# Patient Record
Sex: Female | Born: 1945
Health system: Southern US, Community
[De-identification: ages and names within clinical notes are randomized; demographics above are authoritative.]

## PROBLEM LIST (undated history)

## (undated) DIAGNOSIS — G5601 Carpal tunnel syndrome, right upper limb: Secondary | ICD-10-CM

## (undated) DIAGNOSIS — G5602 Carpal tunnel syndrome, left upper limb: Secondary | ICD-10-CM

## (undated) DIAGNOSIS — E785 Hyperlipidemia, unspecified: Secondary | ICD-10-CM

## (undated) DIAGNOSIS — G8929 Other chronic pain: Secondary | ICD-10-CM

## (undated) DIAGNOSIS — M1990 Unspecified osteoarthritis, unspecified site: Secondary | ICD-10-CM

## (undated) DIAGNOSIS — T7840XA Allergy, unspecified, initial encounter: Secondary | ICD-10-CM

## (undated) DIAGNOSIS — K219 Gastro-esophageal reflux disease without esophagitis: Secondary | ICD-10-CM

## (undated) DIAGNOSIS — L439 Lichen planus, unspecified: Secondary | ICD-10-CM

## (undated) DIAGNOSIS — I10 Essential (primary) hypertension: Secondary | ICD-10-CM

## (undated) DIAGNOSIS — M25512 Pain in left shoulder: Secondary | ICD-10-CM

## (undated) DIAGNOSIS — N189 Chronic kidney disease, unspecified: Secondary | ICD-10-CM

## (undated) HISTORY — DX: Other chronic pain: G89.29

## (undated) HISTORY — DX: Allergy, unspecified, initial encounter: T78.40XA

## (undated) HISTORY — PX: TONSILLECTOMY: SUR1361

## (undated) HISTORY — DX: Pain in left shoulder: M25.512

## (undated) HISTORY — PX: CATARACT EXTRACTION W/ INTRAOCULAR LENS  IMPLANT, BILATERAL: SHX1307

## (undated) HISTORY — PX: TRIGGER FINGER RELEASE: SHX641

## (undated) HISTORY — PX: CARPAL TUNNEL RELEASE: SHX101

## (undated) HISTORY — PX: EYE SURGERY: SHX253

## (undated) HISTORY — DX: Carpal tunnel syndrome, right upper limb: G56.01

## (undated) HISTORY — DX: Chronic kidney disease, unspecified: N18.9

## (undated) HISTORY — DX: Hyperlipidemia, unspecified: E78.5

## (undated) HISTORY — DX: Essential (primary) hypertension: I10

## (undated) HISTORY — PX: VAGINAL HYSTERECTOMY: SUR661

## (undated) HISTORY — DX: Lichen planus, unspecified: L43.9

## (undated) HISTORY — DX: Gastro-esophageal reflux disease without esophagitis: K21.9

## (undated) HISTORY — DX: Unspecified osteoarthritis, unspecified site: M19.90

## (undated) HISTORY — DX: Carpal tunnel syndrome, left upper limb: G56.02

---

## 1999-02-21 ENCOUNTER — Other Ambulatory Visit: Admission: RE | Admit: 1999-02-21 | Discharge: 1999-02-21 | Payer: Self-pay | Admitting: Family Medicine

## 2000-03-21 ENCOUNTER — Other Ambulatory Visit: Admission: RE | Admit: 2000-03-21 | Discharge: 2000-03-21 | Payer: Self-pay | Admitting: Family Medicine

## 2001-05-12 ENCOUNTER — Other Ambulatory Visit: Admission: RE | Admit: 2001-05-12 | Discharge: 2001-05-12 | Payer: Self-pay | Admitting: Family Medicine

## 2002-01-22 HISTORY — PX: PARATHYROIDECTOMY: SHX19

## 2002-03-05 ENCOUNTER — Emergency Department (HOSPITAL_COMMUNITY): Admission: EM | Admit: 2002-03-05 | Discharge: 2002-03-05 | Payer: Self-pay

## 2002-07-28 ENCOUNTER — Other Ambulatory Visit: Admission: RE | Admit: 2002-07-28 | Discharge: 2002-07-28 | Payer: Self-pay | Admitting: Family Medicine

## 2002-08-11 ENCOUNTER — Encounter: Payer: Self-pay | Admitting: Surgery

## 2002-08-11 ENCOUNTER — Ambulatory Visit (HOSPITAL_COMMUNITY): Admission: RE | Admit: 2002-08-11 | Discharge: 2002-08-11 | Payer: Self-pay | Admitting: Surgery

## 2002-09-02 ENCOUNTER — Encounter: Payer: Self-pay | Admitting: Surgery

## 2002-09-07 ENCOUNTER — Ambulatory Visit (HOSPITAL_COMMUNITY): Admission: RE | Admit: 2002-09-07 | Discharge: 2002-09-08 | Payer: Self-pay | Admitting: Surgery

## 2002-09-07 ENCOUNTER — Encounter (INDEPENDENT_AMBULATORY_CARE_PROVIDER_SITE_OTHER): Payer: Self-pay | Admitting: *Deleted

## 2003-01-23 HISTORY — PX: PLANTAR FASCIA SURGERY: SHX746

## 2003-06-30 ENCOUNTER — Other Ambulatory Visit: Admission: RE | Admit: 2003-06-30 | Discharge: 2003-06-30 | Payer: Self-pay | Admitting: Family Medicine

## 2004-06-30 ENCOUNTER — Ambulatory Visit: Payer: Self-pay | Admitting: Family Medicine

## 2004-10-23 ENCOUNTER — Ambulatory Visit: Payer: Self-pay | Admitting: Family Medicine

## 2004-10-30 ENCOUNTER — Ambulatory Visit: Payer: Self-pay | Admitting: Family Medicine

## 2004-11-01 ENCOUNTER — Ambulatory Visit: Payer: Self-pay | Admitting: Internal Medicine

## 2005-02-23 ENCOUNTER — Ambulatory Visit: Payer: Self-pay | Admitting: Family Medicine

## 2005-10-25 ENCOUNTER — Ambulatory Visit: Payer: Self-pay | Admitting: Family Medicine

## 2005-11-01 ENCOUNTER — Ambulatory Visit: Payer: Self-pay | Admitting: Family Medicine

## 2005-11-01 ENCOUNTER — Encounter: Payer: Self-pay | Admitting: Family Medicine

## 2005-11-01 ENCOUNTER — Other Ambulatory Visit: Admission: RE | Admit: 2005-11-01 | Discharge: 2005-11-01 | Payer: Self-pay | Admitting: Family Medicine

## 2006-09-06 DIAGNOSIS — I10 Essential (primary) hypertension: Secondary | ICD-10-CM

## 2006-09-06 DIAGNOSIS — J309 Allergic rhinitis, unspecified: Secondary | ICD-10-CM | POA: Insufficient documentation

## 2006-11-13 ENCOUNTER — Ambulatory Visit: Payer: Self-pay | Admitting: Family Medicine

## 2006-11-13 LAB — CONVERTED CEMR LAB
AST: 21 units/L (ref 0–37)
Albumin: 3.6 g/dL (ref 3.5–5.2)
Basophils Absolute: 0.1 10*3/uL (ref 0.0–0.1)
Bilirubin, Direct: 0.1 mg/dL (ref 0.0–0.3)
CO2: 34 meq/L — ABNORMAL HIGH (ref 19–32)
Chloride: 107 meq/L (ref 96–112)
Cholesterol: 193 mg/dL (ref 0–200)
Creatinine, Ser: 0.9 mg/dL (ref 0.4–1.2)
Eosinophils Relative: 2.5 % (ref 0.0–5.0)
GFR calc Af Amer: 82 mL/min
Glucose, Bld: 93 mg/dL (ref 70–99)
Glucose, Urine, Semiquant: NEGATIVE
HCT: 38.5 % (ref 36.0–46.0)
HDL: 42.6 mg/dL (ref >39.0)
LDL Cholesterol: 127 mg/dL — ABNORMAL HIGH (ref 0–99)
Lymphocytes Relative: 31 % (ref 12.0–46.0)
Nitrite: NEGATIVE
Platelets: 356 10*3/uL (ref 150–400)
Potassium: 4.1 meq/L (ref 3.5–5.1)
Protein, U semiquant: NEGATIVE
RBC: 4.35 M/uL (ref 3.87–5.11)
RDW: 11.6 % (ref 11.5–14.6)
Sodium: 149 meq/L — ABNORMAL HIGH (ref 135–145)
Urobilinogen, UA: NEGATIVE
WBC: 6.9 10*3/uL (ref 4.5–10.5)

## 2006-11-26 ENCOUNTER — Ambulatory Visit: Payer: Self-pay | Admitting: Family Medicine

## 2006-11-26 ENCOUNTER — Other Ambulatory Visit: Admission: RE | Admit: 2006-11-26 | Discharge: 2006-11-26 | Payer: Self-pay | Admitting: Family Medicine

## 2006-11-26 ENCOUNTER — Encounter: Payer: Self-pay | Admitting: Family Medicine

## 2006-12-17 ENCOUNTER — Ambulatory Visit: Payer: Self-pay | Admitting: Family Medicine

## 2006-12-17 DIAGNOSIS — R319 Hematuria, unspecified: Secondary | ICD-10-CM | POA: Insufficient documentation

## 2006-12-17 LAB — CONVERTED CEMR LAB: Ketones, urine, test strip: NEGATIVE

## 2007-01-06 ENCOUNTER — Ambulatory Visit: Payer: Self-pay | Admitting: Family Medicine

## 2007-01-06 LAB — CONVERTED CEMR LAB
Glucose, Urine, Semiquant: NEGATIVE
Protein, U semiquant: NEGATIVE
Specific Gravity, Urine: >=1.03
Urobilinogen, UA: NEGATIVE

## 2007-11-25 ENCOUNTER — Ambulatory Visit: Payer: Self-pay | Admitting: Family Medicine

## 2007-11-25 LAB — CONVERTED CEMR LAB
Albumin: 3.6 g/dL (ref 3.5–5.2)
Alkaline Phosphatase: 84 units/L (ref 39–117)
BUN: 13 mg/dL (ref 6–23)
Bilirubin Urine: NEGATIVE
CO2: 34 meq/L — ABNORMAL HIGH (ref 19–32)
Eosinophils Relative: 1.7 % (ref 0.0–5.0)
GFR calc non Af Amer: 54 mL/min
Glucose, Bld: 92 mg/dL (ref 70–99)
Glucose, Urine, Semiquant: NEGATIVE
HCT: 39.3 % (ref 36.0–46.0)
HDL: 44.2 mg/dL (ref >39.0)
Hemoglobin: 13.7 g/dL (ref 12.0–15.0)
Lymphocytes Relative: 28.5 % (ref 12.0–46.0)
MCV: 88 fL (ref 78.0–100.0)
Monocytes Relative: 8.9 % (ref 3.0–12.0)
Neutro Abs: 4.2 10*3/uL (ref 1.4–7.7)
Nitrite: NEGATIVE
Platelets: 364 10*3/uL (ref 150–400)
Protein, U semiquant: NEGATIVE
RDW: 11.9 % (ref 11.5–14.6)
Sodium: 147 meq/L — ABNORMAL HIGH (ref 135–145)
TSH: 1.77 microintl units/mL (ref 0.35–5.50)
Total Bilirubin: 0.7 mg/dL (ref 0.3–1.2)
Total Protein: 7.6 g/dL (ref 6.0–8.3)
Triglycerides: 114 mg/dL (ref 0–149)
Urobilinogen, UA: 0.2
WBC Urine, dipstick: NEGATIVE

## 2007-12-02 ENCOUNTER — Ambulatory Visit: Payer: Self-pay | Admitting: Family Medicine

## 2008-07-12 ENCOUNTER — Encounter: Payer: Self-pay | Admitting: Family Medicine

## 2008-07-13 ENCOUNTER — Encounter: Payer: Self-pay | Admitting: Family Medicine

## 2008-11-26 ENCOUNTER — Ambulatory Visit: Payer: Self-pay | Admitting: Family Medicine

## 2008-11-26 LAB — CONVERTED CEMR LAB
ALT: 13 units/L (ref 0–35)
AST: 21 units/L (ref 0–37)
Albumin: 3.7 g/dL (ref 3.5–5.2)
Basophils Absolute: 0 10*3/uL (ref 0.0–0.1)
Basophils Relative: 0.6 % (ref 0.0–3.0)
Bilirubin Urine: NEGATIVE
Chloride: 105 meq/L (ref 96–112)
Cholesterol: 190 mg/dL (ref 0–200)
Eosinophils Absolute: 0.1 10*3/uL (ref 0.0–0.7)
Eosinophils Relative: 1.5 % (ref 0.0–5.0)
HCT: 40.6 % (ref 36.0–46.0)
Lymphocytes Relative: 22.9 % (ref 12.0–46.0)
Lymphs Abs: 1.9 10*3/uL (ref 0.7–4.0)
Monocytes Absolute: 0.6 10*3/uL (ref 0.1–1.0)
Monocytes Relative: 7.3 % (ref 3.0–12.0)
Nitrite: NEGATIVE
Potassium: 3.7 meq/L (ref 3.5–5.1)
Sodium: 144 meq/L (ref 135–145)
Specific Gravity, Urine: 1.015 (ref 1.000–1.030)
Total Bilirubin: 0.7 mg/dL (ref 0.3–1.2)
Total Protein, Urine: NEGATIVE mg/dL
VLDL: 21.8 mg/dL (ref 0.0–40.0)
pH: 6 (ref 5.0–8.0)

## 2008-12-10 ENCOUNTER — Ambulatory Visit: Payer: Self-pay | Admitting: Family Medicine

## 2008-12-10 DIAGNOSIS — M25559 Pain in unspecified hip: Secondary | ICD-10-CM

## 2008-12-14 ENCOUNTER — Encounter (INDEPENDENT_AMBULATORY_CARE_PROVIDER_SITE_OTHER): Payer: Self-pay | Admitting: *Deleted

## 2008-12-14 ENCOUNTER — Ambulatory Visit: Payer: Self-pay | Admitting: Family Medicine

## 2008-12-21 DIAGNOSIS — L439 Lichen planus, unspecified: Secondary | ICD-10-CM

## 2008-12-21 HISTORY — DX: Lichen planus, unspecified: L43.9

## 2009-01-18 ENCOUNTER — Encounter: Payer: Self-pay | Admitting: *Deleted

## 2009-01-26 ENCOUNTER — Ambulatory Visit: Payer: Self-pay | Admitting: Family Medicine

## 2009-12-01 ENCOUNTER — Ambulatory Visit: Payer: Self-pay | Admitting: Family Medicine

## 2009-12-01 LAB — CONVERTED CEMR LAB
AST: 23 units/L (ref 0–37)
Albumin: 3.6 g/dL (ref 3.5–5.2)
Alkaline Phosphatase: 81 units/L (ref 39–117)
BUN: 25 mg/dL — ABNORMAL HIGH (ref 6–23)
Bilirubin Urine: NEGATIVE
Calcium: 10.3 mg/dL (ref 8.4–10.5)
Creatinine, Ser: 0.9 mg/dL (ref 0.4–1.2)
GFR calc non Af Amer: 63.95 mL/min (ref >60)
Ketones, ur: NEGATIVE mg/dL
LDL Cholesterol: 131 mg/dL — ABNORMAL HIGH (ref 0–99)
Lymphs Abs: 2.1 10*3/uL (ref 0.7–4.0)
Monocytes Absolute: 0.8 10*3/uL (ref 0.1–1.0)
Monocytes Relative: 9.5 % (ref 3.0–12.0)
Platelets: 362 10*3/uL (ref 150.0–400.0)
RBC: 4.63 M/uL (ref 3.87–5.11)
RDW: 13.1 % (ref 11.5–14.6)
Specific Gravity, Urine: 1.02 (ref 1.000–1.030)
Total Bilirubin: 0.4 mg/dL (ref 0.3–1.2)
Total CHOL/HDL Ratio: 5
Total Protein, Urine: NEGATIVE mg/dL
Urine Glucose: NEGATIVE mg/dL
Urobilinogen, UA: 0.2 (ref 0.0–1.0)
WBC: 8.1 10*3/uL (ref 4.5–10.5)

## 2009-12-12 ENCOUNTER — Ambulatory Visit: Payer: Self-pay | Admitting: Family Medicine

## 2009-12-12 ENCOUNTER — Encounter: Payer: Self-pay | Admitting: Family Medicine

## 2010-01-25 ENCOUNTER — Encounter: Payer: Self-pay | Admitting: Family Medicine

## 2010-02-21 NOTE — Assessment & Plan Note (Signed)
Summary: cpx/cjr   Vital Signs:  Patient profile:   65 year old female Menstrual status:  hysterectomy Height:      62.25 inches Weight:      187 pounds BMI:     34.05 Temp:     97.9 degrees F oral BP sitting:   120 / 84  (left arm) Cuff size:   regular  Vitals Entered By: Kern Reap CMA Duncan Dull) (December 12, 2009 1:58 PM) CC: cpx Is Patient Diabetic? No Pain Assessment Patient in pain? no        CC:  cpx.  History of Present Illness: Marie Ramirez is a 65 year old, married female, nonsmoker, who comes in today for physical examination because of a history of hypertension, postmenopausal vaginal dryness,  Her hypertension is treated with Tenoretic one half tab daily BP 124/84.  She also takes 10 mg of Zyrtec nightly for allergic rhinitis, calcium and vitamin D, and Flonase nasal spray p.r.n.  She uses Premarin vaginal cream once weekly for vaginal dryness and steroid cream for eczema.  She gets routine eye care, dental care, BSE monthly, and a mammography, colonoscopy, normal, tetanus, 2003, seasonal flu 2011, Pneumovax 2009.  Allergies: 1)  ! Penicillin  Past History:  Past medical, surgical, family and social histories (including risk factors) reviewed, and no changes noted (except as noted below).  Past Medical History: Reviewed history from 09/06/2006 and no changes required. Allergic rhinitis Hypertension PMS  Past Surgical History: Reviewed history from 09/06/2006 and no changes required. Childbirth x 2 Dental work TAH Colonoscopy-12/10/2001  Family History: Reviewed history from 11/26/2006 and no changes required. mother has dementia  Social History: Reviewed history from 11/26/2006 and no changes required. Married Never Smoked Alcohol use-no Drug use-no Regular exercise-yes  Review of Systems      See HPI  Physical Exam  General:  Well-developed,well-nourished,in no acute distress; alert,appropriate and cooperative throughout examination Head:   Normocephalic and atraumatic without obvious abnormalities. No apparent alopecia or balding. Eyes:  No corneal or conjunctival inflammation noted. EOMI. Perrla. Funduscopic exam benign, without hemorrhages, exudates or papilledema. Vision grossly normal. Ears:  External ear exam shows no significant lesions or deformities.  Otoscopic examination reveals clear canals, tympanic membranes are intact bilaterally without bulging, retraction, inflammation or discharge. Hearing is grossly normal bilaterally. Nose:  External nasal examination shows no deformity or inflammation. Nasal mucosa are pink and moist without lesions or exudates. Mouth:  Oral mucosa and oropharynx without lesions or exudates.  Teeth in good repair. Neck:  No deformities, masses, or tenderness noted. Chest Wall:  No deformities, masses, or tenderness noted. Breasts:  No mass, nodules, thickening, tenderness, bulging, retraction, inflamation, nipple discharge or skin changes noted.   Lungs:  Normal respiratory effort, chest expands symmetrically. Lungs are clear to auscultation, no crackles or wheezes. Heart:  Normal rate and regular rhythm. S1 and S2 normal without gallop, murmur, click, rub or other extra sounds. Abdomen:  Bowel sounds positive,abdomen soft and non-tender without masses, organomegaly or hernias noted. Rectal:  No external abnormalities noted. Normal sphincter tone. No rectal masses or tenderness. Genitalia:  Pelvic Exam:        External: normal female genitalia without lesions or masses        Vagina: normal without lesions or masses        Cervix: normal without lesions or masses        Adnexa: normal bimanual exam without masses or fullness        Uterus: normal by palpation  Pap smear: not performed Msk:  No deformity or scoliosis noted of thoracic or lumbar spine.   Pulses:  R and L carotid,radial,femoral,dorsalis pedis and posterior tibial pulses are full and equal bilaterally Extremities:  No  clubbing, cyanosis, edema, or deformity noted with normal full range of motion of all joints.   Neurologic:  No cranial nerve deficits noted. Station and gait are normal. Plantar reflexes are down-going bilaterally. DTRs are symmetrical throughout. Sensory, motor and coordinative functions appear intact. Skin:  Intact without suspicious lesions or rashes Cervical Nodes:  No lymphadenopathy noted Axillary Nodes:  No palpable lymphadenopathy Inguinal Nodes:  No significant adenopathy Psych:  Cognition and judgment appear intact. Alert and cooperative with normal attention span and concentration. No apparent delusions, illusions, hallucinations   Impression & Recommendations:  Problem # 1:  PHYSICAL EXAMINATION (ICD-V70.0) Assessment Unchanged  Orders: Prescription Created Electronically [phone]) EKG w/ Interpretation (93000)  Problem # 2:  HYPERTENSION (ICD-401.9) Assessment: Improved  Her updated medication list for this problem includes:    Tenoretic 50 50-25 Mg Tabs (Atenolol-chlorthalidone) .Marland Kitchen... 1/2 tablet once daily  Orders: Prescription Created Electronically [phone]) EKG w/ Interpretation (93000)  Problem # 3:  ALLERGIC RHINITIS (ICD-477.9) Assessment: Improved  Her updated medication list for this problem includes:    Zyrtec Allergy 10 Mg Tabs (Cetirizine hcl) ..... Otc       as needed    Flonase 50 Mcg/act Susp (Fluticasone propionate) .Marland Kitchen... As needed for allergies  Orders: Prescription Created Electronically [phone])  Complete Medication List: 1)  Tenoretic 50 50-25 Mg Tabs (Atenolol-chlorthalidone) .... 1/2 tablet once daily 2)  Zyrtec Allergy 10 Mg Tabs (Cetirizine hcl) .... Otc       as needed 3)  Ca 1800+ & D 1000  .... Once daily 4)  Flonase 50 Mcg/act Susp (Fluticasone propionate) .... As needed for allergies 5)  Premarin 0.625 Mg/gm Crea (Estrogens, conjugated) .... Uad 6)  Aspir-low 81 Mg Tbec (Aspirin) .... Take one tab once daily 7)  Fluocinonide 0.05 %  Oint (Fluocinonide) .... Apply at bedtime 8)  Prednisone 20 Mg Tabs (Prednisone) .... Uad 9)  Hydromet 5-1.5 Mg/104ml Syrp (Hydrocodone-homatropine) .Marland Kitchen.. 1 or 2 tsps at bedtime as needed  Patient Instructions: 1)  Please schedule a follow-up appointment in 1 year. 2)  It is important that you exercise regularly at least 20 minutes 5 times a week. If you develop chest pain, have severe difficulty breathing, or feel very tired , stop exercising immediately and seek medical attention. 3)  You need to lose weight. Consider a lower calorie diet and regular exercise.  4)  Schedule your mammogram. 5)  Schedule a colonoscopy/sigmoidoscopy to help detect colon cancer. 6)  Take calcium +Vitamin D daily. 7)  Take an Aspirin every day. Prescriptions: FLUOCINONIDE 0.05 % OINT (FLUOCINONIDE) apply at bedtime  #60 gm x 2   Entered and Authorized by:   Roderick Pee MD   Signed by:   Roderick Pee MD on 12/12/2009   Method used:   Electronically to        [facility] [address] [phone]* (retail)       [address] [place], [place]  [address]       Ph: [phone]       Fax: [phone]   RxID:   [id number] PREMARIN 0.625 MG/GM CREA (ESTROGENS, CONJUGATED) UAD  #3 tubes x 6   Entered and Authorized by:   Roderick Pee MD   Signed  by:   Roderick Pee MD on 12/12/2009   Method used:   Electronically to        [facility] [phone]* (retail)       [address] [place], [place]  [address]       Ph: [phone]       Fax: [phone]   RxID:   [id number] FLONASE 50 MCG/ACT SUSP (FLUTICASONE PROPIONATE) as needed for allergies  #48 Gram x 6   Entered and Authorized by:   Roderick Pee MD   Signed by:   Roderick Pee MD on 12/12/2009   Method used:   Electronically to        [facility] [address] [phone]* (retail)       [address] [place], [place]  [address]       Ph: [phone]       Fax: [phone]   RxID:   [id number] TENORETIC 50 50-25 MG  TABS  (ATENOLOL-CHLORTHALIDONE) 1/2 tablet once daily  #50 x 3   Entered and Authorized by:   Roderick Pee MD   Signed by:   Roderick Pee MD on 12/12/2009   Method used:   Electronically to        [facility] [address] [phone]* (retail)       [address] [place], [place]  [address]       Ph: [phone]       Fax: [phone]   RxID:   [id number]    Orders Added: 1)  Prescription Created Electronically [G8553] 2)  Est. Patient 40-64 years [99396] 3)  EKG w/ Interpretation [93000]   Immunization History:  Influenza Immunization History:    Influenza:  historical (10/22/2009)   Immunization History:  Influenza Immunization History:    Influenza:  Historical (10/22/2009)

## 2010-02-21 NOTE — Assessment & Plan Note (Signed)
Summary: ?sinus inf/cough/chest congestion/pt coming in at 11:30am/cjr   Vital Signs:  Patient profile:   65 year old female Menstrual status:  hysterectomy Weight:      187 pounds Temp:     97.8 degrees F oral BP sitting:   120 / 90  (left arm) Cuff size:   regular  Vitals Entered By: Kern Reap CMA Duncan Dull) (January 26, 2009 12:23 PM)  Reason for Visit head congestion, sore throat  History of Present Illness: Marie Ramirez is a 65 year old female, who has a history of underlying allergic rhinitis, who comes in today with head congestion voice loss and cough for one week.  She said no fever, earache, sputum production, et Karie Soda.  Review of systems otherwise negative  Allergies: 1)  ! Penicillin  Past History:  Past medical, surgical, family and social histories (including risk factors) reviewed for relevance to current acute and chronic problems.  Past Medical History: Reviewed history from 09/06/2006 and no changes required. Allergic rhinitis Hypertension PMS  Past Surgical History: Reviewed history from 09/06/2006 and no changes required. Childbirth x 2 Dental work TAH Colonoscopy-12/10/2001  Family History: Reviewed history from 11/26/2006 and no changes required. mother has dementia  Social History: Reviewed history from 11/26/2006 and no changes required. Married Never Smoked Alcohol use-no Drug use-no Regular exercise-yes  Review of Systems      See HPI  Physical Exam  General:  Well-developed,well-nourished,in no acute distress; alert,appropriate and cooperative throughout examination Head:  Normocephalic and atraumatic without obvious abnormalities. No apparent alopecia or balding. Eyes:  No corneal or conjunctival inflammation noted. EOMI. Perrla. Funduscopic exam benign, without hemorrhages, exudates or papilledema. Vision grossly normal. Ears:  External ear exam shows no significant lesions or deformities.  Otoscopic examination reveals clear  canals, tympanic membranes are intact bilaterally without bulging, retraction, inflammation or discharge. Hearing is grossly normal bilaterally. Nose:  External nasal examination shows no deformity or inflammation. Nasal mucosa are pink and moist without lesions or exudates. Mouth:  Oral mucosa and oropharynx without lesions or exudates.  Teeth in good repair. Neck:  No deformities, masses, or tenderness noted. Chest Wall:  No deformities, masses, or tenderness noted. Lungs:  Normal respiratory effort, chest expands symmetrically. Lungs are clear to auscultation, no crackles or wheezes.   Impression & Recommendations:  Problem # 1:  ALLERGIC RHINITIS (ICD-477.9) Assessment Deteriorated  Her updated medication list for this problem includes:    Zyrtec Allergy 10 Mg Tabs (Cetirizine hcl) ..... Otc       as needed    Flonase 50 Mcg/act Susp (Fluticasone propionate) .Marland Kitchen... As needed for allergies  Orders: Prescription Created Electronically [phone])  Complete Medication List: 1)  Tenoretic 50 50-25 Mg Tabs (Atenolol-chlorthalidone) .... 1/2 tablet once daily 2)  Zyrtec Allergy 10 Mg Tabs (Cetirizine hcl) .... Otc       as needed 3)  Ca 1800+ & D 1000  .... Once daily 4)  Flonase 50 Mcg/act Susp (Fluticasone propionate) .... As needed for allergies 5)  Premarin 0.625 Mg/gm Crea (Estrogens, conjugated) .... Uad 6)  Aspir-low 81 Mg Tbec (Aspirin) .... Take one tab once daily 7)  Fluocinonide 0.05 % Oint (Fluocinonide) .... Apply at bedtime 8)  Prednisone 20 Mg Tabs (Prednisone) .... Uad 9)  Hydromet 5-1.5 Mg/51ml Syrp (Hydrocodone-homatropine) .Marland Kitchen.. 1 or 2 tsps at bedtime as needed  Patient Instructions: 1)  drink 30 ounces of water daily, he may take one or 2 teaspoons of Hydromet at bedtime as needed for sore throat and cough.  2)  Begin prednisone take one tab x 5 days after tablet x 5 days, then half a tablet Monday, Wednesday, Friday, for a two week taper Prescriptions: HYDROMET 5-1.5  MG/5ML SYRP (HYDROCODONE-HOMATROPINE) 1 or 2 tsps at bedtime as needed  #8oz x 1   Entered and Authorized by:   Roderick Pee MD   Signed by:   Roderick Pee MD on 01/26/2009   Method used:   Print then Give to Patient   RxID:   [id number] PREDNISONE 20 MG TABS (PREDNISONE) UAD  #30 x 1   Entered and Authorized by:   Roderick Pee MD   Signed by:   Roderick Pee MD on 01/26/2009   Method used:   Electronically to        [facility] [address] [phone]* (retail)       [address] [place]  [address]       Ph: [phone]       Fax: [phone]   RxID:   [id number]

## 2010-02-23 NOTE — Miscellaneous (Signed)
Summary: mammogram update  Clinical Lists Changes  Observations: Added new observation of MAMMO DUE: 01/2011 (01/25/2010 10:37) Added new observation of MAMMOGRAM: normal (01/19/2010 10:38)      Preventive Care Screening  Mammogram:    Date:  01/19/2010    Next Due:  01/2011    Results:  normal

## 2010-06-09 NOTE — Op Note (Signed)
Marie Ramirez, Marie Ramirez                             ACCOUNT NO.:  [id number]   MEDICAL RECORD NO.:  [id number]                   PATIENT TYPE:  OIB   LOCATION:  5731                                 FACILITY:  [facility]   PHYSICIAN:  Currie Paris, M.D.           DATE OF BIRTH:  11/20/1945   DATE OF PROCEDURE:  09/07/2002  DATE OF DISCHARGE:                                 OPERATIVE REPORT   PREOPERATIVE DIAGNOSIS:  Parathyroid adenoma, right inferior parathyroid.   POSTOPERATIVE DIAGNOSIS:  Parathyroid adenoma, right inferior parathyroid.   OPERATION:  Minimally-invasive parathyroidectomy.   SURGEON:  Currie Paris, M.D.   ASSISTANT:  Velora Heckler, M.D.   ANESTHESIA:  General endotracheal.   CLINICAL HISTORY:  This patient is a 65 year old lady with  hyperparathyroidism and what appeared to be a parathyroid adenoma on  sestamibi scan in the right inferior location.   DESCRIPTION OF PROCEDURE:  The patient was seen in the preoperative area and  had no further questions.  She had already been injected with her  radionuclide for her localization.   She was taken to the operating room and after satisfactory general  endotracheal anesthesia, was positioned on the table with her head extended.  Using the Neoprobe I found a hot area in the right inferior area and marked  it.  The neck was then prepped and draped.   A short incision was made directly over the area marked extending from the  midline to the area that I thought contained the adenoma.  The subcutaneous  tissues and platysma were divided with the cautery.  A little subplatysmal  flap was raised.  This is __________ to the midline and opened the midline  and retracted the strap muscles laterally and rotated the thyroid medially.  With brief dissection guided somewhat by the Neoprobe, I identified a large  adenoma and carefully dissected this out with a combination of sharp and  blunt dissection.  I was able to  identify the vascular structures and clip  it, and it was dissected out staying very close to it so as not to injure  any surrounding organs.  It came out intact and was sent for frozen section.  We irrigated and made sure everything was dry, and it appeared to be  completely dry.  I did leave a little Surgicel in.  I closed the midline  strap muscles to reapproximate them, closed the platysma and then the skin  with some staples.   The patient tolerated the procedure well.  There were no operative  complications.  All counts were correct.  Pathology reported that this did  appear to be a parathyroid with changes consistent with adenoma.  Currie Paris, M.D.    CJS/MEDQ  D:  09/07/2002  T:  09/08/2002  Job:  [id number]   cc:   Tinnie Gens A. Tawanna Cooler, M.D. [facility]

## 2010-09-06 IMAGING — CR DG HIP (WITH OR WITHOUT PELVIS) 2-3V*L*
3 series · 3 of 3 positions shown · non-contrast
Comparison: None

CLINICAL DATA: Left hip pain.

LEFT HIP - COMPLETE 2+ VIEW

[view not recorded (1 of 3)]
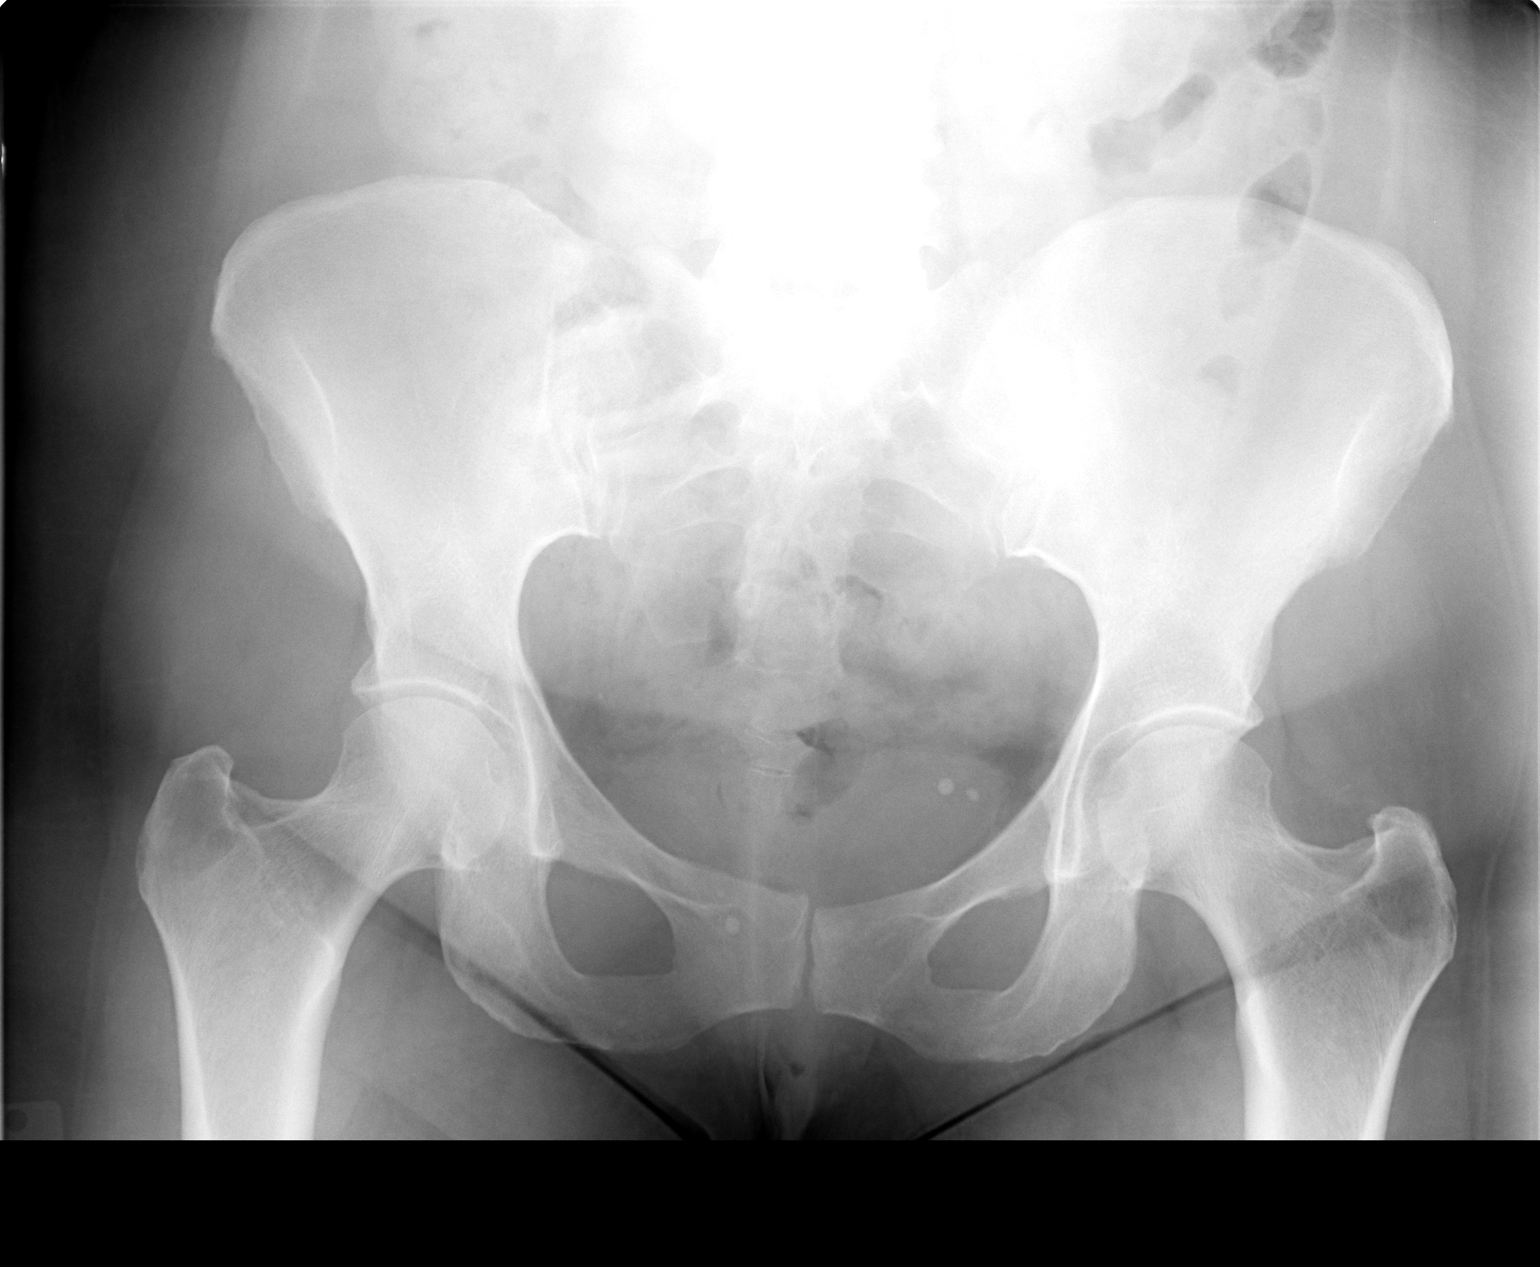

[view not recorded (2 of 3)]
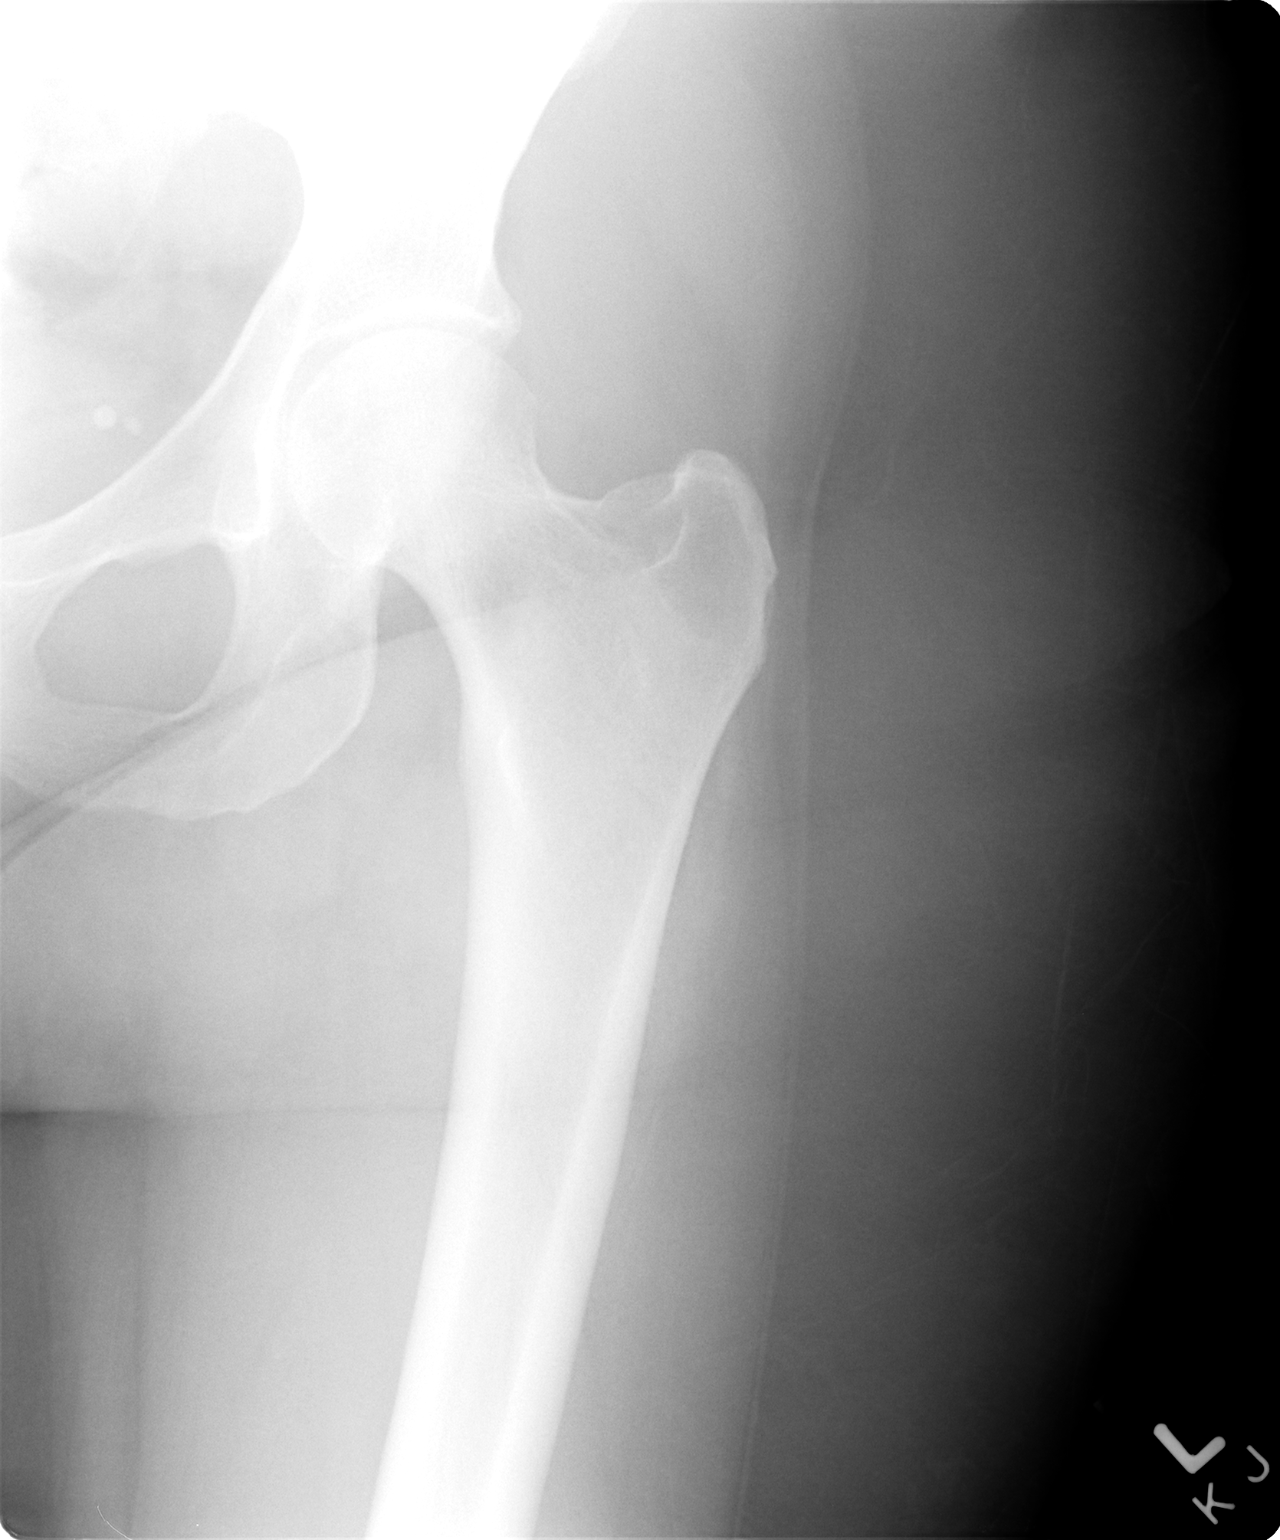

[view not recorded (3 of 3)]
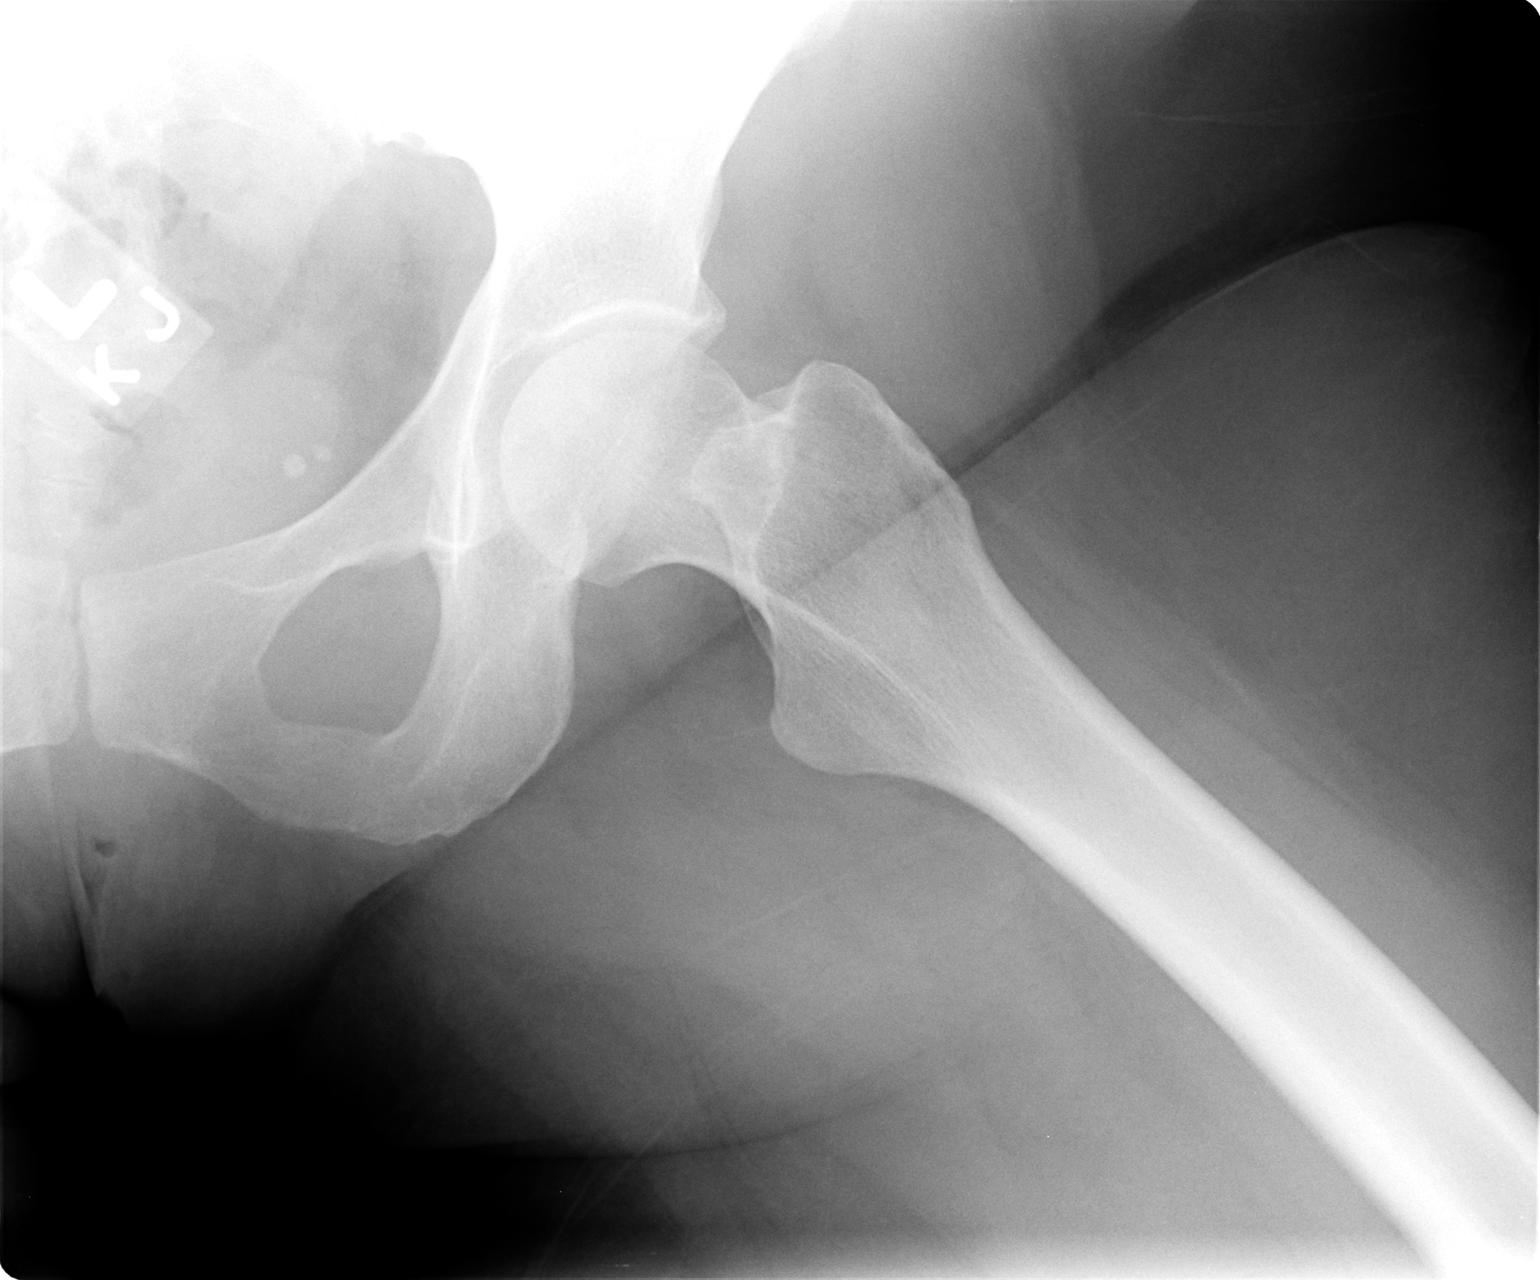

[3 of 3 positions shown; findings below may reference images not displayed]

FINDINGS: There is no evidence of hip fracture or dislocation.
There is no evidence of arthropathy or other focal bone
abnormality.
IMPRESSION: Negative.

## 2011-02-19 ENCOUNTER — Other Ambulatory Visit: Payer: Self-pay | Admitting: Family Medicine

## 2011-05-18 ENCOUNTER — Other Ambulatory Visit (INDEPENDENT_AMBULATORY_CARE_PROVIDER_SITE_OTHER): Payer: BC Managed Care – PPO

## 2011-05-18 DIAGNOSIS — Z0000 Encounter for general adult medical examination without abnormal findings: Secondary | ICD-10-CM

## 2011-05-18 LAB — POCT URINALYSIS DIPSTICK
Bilirubin, UA: NEGATIVE
Glucose, UA: NEGATIVE
Spec Grav, UA: 1.02

## 2011-05-18 LAB — HEPATIC FUNCTION PANEL
AST: 20 U/L (ref 0–37)
Albumin: 3.7 g/dL (ref 3.5–5.2)
Alkaline Phosphatase: 82 U/L (ref 39–117)
Total Bilirubin: 0.4 mg/dL (ref 0.3–1.2)

## 2011-05-18 LAB — CBC WITH DIFFERENTIAL/PLATELET
Basophils Absolute: 0.1 10*3/uL (ref 0.0–0.1)
Eosinophils Relative: 2 % (ref 0.0–5.0)
Hemoglobin: 13.8 g/dL (ref 12.0–15.0)
Lymphocytes Relative: 22.1 % (ref 12.0–46.0)
Monocytes Relative: 8.3 % (ref 3.0–12.0)
Neutro Abs: 5.1 10*3/uL (ref 1.4–7.7)
RDW: 13 % (ref 11.5–14.6)
WBC: 7.6 10*3/uL (ref 4.5–10.5)

## 2011-05-18 LAB — LIPID PANEL
HDL: 46.5 mg/dL (ref >39.00)
LDL Cholesterol: 127 mg/dL — ABNORMAL HIGH (ref 0–99)
Total CHOL/HDL Ratio: 4
Triglycerides: 133 mg/dL (ref 0.0–149.0)

## 2011-05-18 LAB — BASIC METABOLIC PANEL
Calcium: 10 mg/dL (ref 8.4–10.5)
GFR: 53.65 mL/min — ABNORMAL LOW (ref >60.00)
Glucose, Bld: 85 mg/dL (ref 70–99)
Sodium: 145 mEq/L (ref 135–145)

## 2011-05-31 ENCOUNTER — Encounter: Payer: Self-pay | Admitting: Family Medicine

## 2011-05-31 ENCOUNTER — Ambulatory Visit (INDEPENDENT_AMBULATORY_CARE_PROVIDER_SITE_OTHER): Payer: BC Managed Care – PPO | Admitting: Family Medicine

## 2011-05-31 VITALS — BP 120/86 | Temp 98.2°F | Ht 64.0 in | Wt 194.0 lb

## 2011-05-31 DIAGNOSIS — E669 Obesity, unspecified: Secondary | ICD-10-CM

## 2011-05-31 DIAGNOSIS — I10 Essential (primary) hypertension: Secondary | ICD-10-CM

## 2011-05-31 DIAGNOSIS — M25519 Pain in unspecified shoulder: Secondary | ICD-10-CM

## 2011-05-31 DIAGNOSIS — M25512 Pain in left shoulder: Secondary | ICD-10-CM

## 2011-05-31 DIAGNOSIS — G8929 Other chronic pain: Secondary | ICD-10-CM

## 2011-05-31 DIAGNOSIS — N952 Postmenopausal atrophic vaginitis: Secondary | ICD-10-CM

## 2011-05-31 DIAGNOSIS — J309 Allergic rhinitis, unspecified: Secondary | ICD-10-CM

## 2011-05-31 HISTORY — DX: Other chronic pain: G89.29

## 2011-05-31 MED ORDER — ESTROGENS, CONJUGATED 0.625 MG/GM VA CREA: 0.5000 g | TOPICAL_CREAM | Freq: Every day | VAGINAL | Status: DC

## 2011-05-31 MED ORDER — ATENOLOL-CHLORTHALIDONE 50-25 MG PO TABS: ORAL_TABLET | ORAL | Status: DC

## 2011-05-31 NOTE — Progress Notes (Signed)
  Subjective:    Patient ID: Marie Ramirez, female    DOB: [birth year], 66 y.o.   MRN: [id number]  HPI In is a 66 year old married female nonsmoker who comes in today for physical examination because of a history of hypertension postmenopausal vaginal dryness obesity allergic rhinitis  She takes Tenoretic 5025-one half tab daily BP 120/86  Uses Premarin vaginal cream twice weekly for vaginal dryness  She takes Zyrtec 10 mg each bedtime for allergic rhinitis  She said shoulder pain left-sided for 8 months plus. She states she think she and her to lifting her mother who was bedridden with dementia and essentially died last fall. Now she can't raise her shoulder  She gets routine eye care,,,,,,, recent bilateral cataract removal and lens implants Dr. Jettie Pagan,,,,,, regular dental care, BSE monthly, and you mammography, colonoscopy and GI, tetanus 2011 Pneumovax 2009   Review of Systems  Constitutional: Negative.   HENT: Negative.   Eyes: Negative.   Respiratory: Negative.   Cardiovascular: Negative.   Gastrointestinal: Negative.   Genitourinary: Negative.   Musculoskeletal: Negative.   Neurological: Negative.   Hematological: Negative.   Psychiatric/Behavioral: Negative.        Objective:   Physical Exam  Constitutional: She appears well-developed and well-nourished.  HENT:  Head: Normocephalic and atraumatic.  Right Ear: External ear normal.  Left Ear: External ear normal.  Nose: Nose normal.  Mouth/Throat: Oropharynx is clear and moist.  Eyes: EOM are normal. Pupils are equal, round, and reactive to light.  Neck: Normal range of motion. Neck supple. No thyromegaly present.  Cardiovascular: Normal rate, regular rhythm, normal heart sounds and intact distal pulses.  Exam reveals no gallop and no friction rub.   No murmur heard. Pulmonary/Chest: Effort normal and breath sounds normal.  Abdominal: Soft. Bowel sounds are normal. She exhibits no distension and no mass. There is no  tenderness. There is no rebound.  Genitourinary: Vagina normal. Guaiac negative stool. No vaginal discharge found.  Musculoskeletal: Normal range of motion.  Lymphadenopathy:    She has no cervical adenopathy.  Neurological: She is alert. She has normal reflexes. No cranial nerve deficit. She exhibits normal muscle tone. Coordination normal.  Skin: Skin is warm and dry.  Psychiatric: She has a normal mood and affect. Her behavior is normal. Judgment and thought content normal.   She has very limited use of her left shoulder and cannot externally rotated       Assessment & Plan:  Healthy female  Obesity encouraged exercise and weight loss  Hypertension continue Tenoretic one half tab daily  Postmenopausal vaginal dryness continue Premarin vaginal cream twice weekly  Allergic rhinitis Zyrtec plain 10 mg each bedtime  Beginnings of frozen shoulder left refer to Dr. Cleophas Dunker

## 2011-05-31 NOTE — Patient Instructions (Signed)
Continue the Tenoretic,,,,,,,, one half tablet daily for blood pressure control  Use small amounts of Premarin vaginal cream twice weekly  Zyrtec 10 mg plane at bedtime for allergic rhinitis  Begin Motrin 400 mg twice daily for your shoulder and call [facility] and make an appointment to see Dr. Albertha Ghee  Begin a diet exercise and weight loss program  Remember to do a thorough breast exam monthly and gets her mammogram yearly  Followup in 1 year sooner if any problems

## 2011-10-25 ENCOUNTER — Encounter: Payer: Self-pay | Admitting: Internal Medicine

## 2012-04-14 DIAGNOSIS — H43819 Vitreous degeneration, unspecified eye: Secondary | ICD-10-CM | POA: Diagnosis not present

## 2012-05-19 ENCOUNTER — Encounter: Payer: Self-pay | Admitting: Internal Medicine

## 2012-05-30 ENCOUNTER — Other Ambulatory Visit: Payer: Self-pay | Admitting: *Deleted

## 2012-05-30 DIAGNOSIS — I10 Essential (primary) hypertension: Secondary | ICD-10-CM

## 2012-05-30 MED ORDER — ATENOLOL-CHLORTHALIDONE 50-25 MG PO TABS: ORAL_TABLET | ORAL | Status: DC

## 2012-07-31 DIAGNOSIS — Z803 Family history of malignant neoplasm of breast: Secondary | ICD-10-CM | POA: Diagnosis not present

## 2012-07-31 DIAGNOSIS — Z1231 Encounter for screening mammogram for malignant neoplasm of breast: Secondary | ICD-10-CM | POA: Diagnosis not present

## 2012-08-05 DIAGNOSIS — N6019 Diffuse cystic mastopathy of unspecified breast: Secondary | ICD-10-CM | POA: Diagnosis not present

## 2012-08-05 DIAGNOSIS — Z09 Encounter for follow-up examination after completed treatment for conditions other than malignant neoplasm: Secondary | ICD-10-CM | POA: Diagnosis not present

## 2012-08-27 DIAGNOSIS — M25569 Pain in unspecified knee: Secondary | ICD-10-CM | POA: Diagnosis not present

## 2012-08-27 DIAGNOSIS — M1710 Unilateral primary osteoarthritis, unspecified knee: Secondary | ICD-10-CM | POA: Diagnosis not present

## 2012-09-16 ENCOUNTER — Other Ambulatory Visit: Payer: Self-pay | Admitting: Family Medicine

## 2012-12-16 ENCOUNTER — Other Ambulatory Visit: Payer: Self-pay | Admitting: Family Medicine

## 2013-02-17 ENCOUNTER — Ambulatory Visit (INDEPENDENT_AMBULATORY_CARE_PROVIDER_SITE_OTHER): Payer: Medicare Other | Admitting: Family Medicine

## 2013-02-17 ENCOUNTER — Encounter: Payer: Self-pay | Admitting: Family Medicine

## 2013-02-17 VITALS — BP 140/90 | Temp 98.4°F | Ht 63.0 in | Wt 192.0 lb

## 2013-02-17 DIAGNOSIS — Z87448 Personal history of other diseases of urinary system: Secondary | ICD-10-CM

## 2013-02-17 DIAGNOSIS — N952 Postmenopausal atrophic vaginitis: Secondary | ICD-10-CM

## 2013-02-17 DIAGNOSIS — E669 Obesity, unspecified: Secondary | ICD-10-CM | POA: Diagnosis not present

## 2013-02-17 DIAGNOSIS — Z23 Encounter for immunization: Secondary | ICD-10-CM

## 2013-02-17 DIAGNOSIS — R21 Rash and other nonspecific skin eruption: Secondary | ICD-10-CM | POA: Diagnosis not present

## 2013-02-17 DIAGNOSIS — Z0000 Encounter for general adult medical examination without abnormal findings: Secondary | ICD-10-CM

## 2013-02-17 DIAGNOSIS — J309 Allergic rhinitis, unspecified: Secondary | ICD-10-CM | POA: Diagnosis not present

## 2013-02-17 DIAGNOSIS — M25559 Pain in unspecified hip: Secondary | ICD-10-CM

## 2013-02-17 DIAGNOSIS — I10 Essential (primary) hypertension: Secondary | ICD-10-CM

## 2013-02-17 LAB — TSH: TSH: 1.91 u[IU]/mL (ref 0.35–5.50)

## 2013-02-17 LAB — CBC WITH DIFFERENTIAL/PLATELET
Basophils Absolute: 0 10*3/uL (ref 0.0–0.1)
Basophils Relative: 0.5 % (ref 0.0–3.0)
EOS ABS: 0.2 10*3/uL (ref 0.0–0.7)
Eosinophils Relative: 2 % (ref 0.0–5.0)
HEMATOCRIT: 42.8 % (ref 36.0–46.0)
HEMOGLOBIN: 14.3 g/dL (ref 12.0–15.0)
LYMPHS ABS: 2.5 10*3/uL (ref 0.7–4.0)
Lymphocytes Relative: 32.9 % (ref 12.0–46.0)
MCHC: 33.4 g/dL (ref 30.0–36.0)
MCV: 86.1 fl (ref 78.0–100.0)
Monocytes Absolute: 0.6 10*3/uL (ref 0.1–1.0)
Monocytes Relative: 8.2 % (ref 3.0–12.0)
NEUTROS ABS: 4.4 10*3/uL (ref 1.4–7.7)
Neutrophils Relative %: 56.4 % (ref 43.0–77.0)
Platelets: 394 10*3/uL (ref 150.0–400.0)
RBC: 4.98 Mil/uL (ref 3.87–5.11)
RDW: 12.8 % (ref 11.5–14.6)
WBC: 7.7 10*3/uL (ref 4.5–10.5)

## 2013-02-17 LAB — BASIC METABOLIC PANEL
BUN: 19 mg/dL (ref 6–23)
CO2: 29 mEq/L (ref 19–32)
CREATININE: 1.1 mg/dL (ref 0.4–1.2)
Calcium: 9.9 mg/dL (ref 8.4–10.5)
Chloride: 106 mEq/L (ref 96–112)
GFR: 50.67 mL/min — AB (ref >60.00)
GLUCOSE: 94 mg/dL (ref 70–99)
POTASSIUM: 4.5 meq/L (ref 3.5–5.1)
Sodium: 143 mEq/L (ref 135–145)

## 2013-02-17 LAB — HEPATIC FUNCTION PANEL
ALBUMIN: 3.9 g/dL (ref 3.5–5.2)
ALK PHOS: 77 U/L (ref 39–117)
ALT: 14 U/L (ref 0–35)
AST: 21 U/L (ref 0–37)
BILIRUBIN DIRECT: 0 mg/dL (ref 0.0–0.3)
BILIRUBIN TOTAL: 0.6 mg/dL (ref 0.3–1.2)
Total Protein: 8 g/dL (ref 6.0–8.3)

## 2013-02-17 LAB — POCT URINALYSIS DIPSTICK
Bilirubin, UA: NEGATIVE
Glucose, UA: NEGATIVE
Ketones, UA: NEGATIVE
Leukocytes, UA: NEGATIVE
Nitrite, UA: NEGATIVE
PH UA: 5.5
Protein, UA: NEGATIVE
Spec Grav, UA: 1.02
Urobilinogen, UA: 0.2

## 2013-02-17 LAB — LIPID PANEL
CHOL/HDL RATIO: 5
CHOLESTEROL: 229 mg/dL — AB (ref 0–200)
HDL: 44.7 mg/dL (ref >39.00)
Triglycerides: 155 mg/dL — ABNORMAL HIGH (ref 0.0–149.0)
VLDL: 31 mg/dL (ref 0.0–40.0)

## 2013-02-17 LAB — LDL CHOLESTEROL, DIRECT: LDL DIRECT: 158.3 mg/dL

## 2013-02-17 MED ORDER — ESTROGENS, CONJUGATED 0.625 MG/GM VA CREA: 0.5000 g | TOPICAL_CREAM | Freq: Every day | VAGINAL | Status: DC

## 2013-02-17 MED ORDER — TRIAMCINOLONE ACETONIDE 0.025 % EX OINT: 1.0000 "application " | TOPICAL_OINTMENT | Freq: Two times a day (BID) | CUTANEOUS | Status: DC

## 2013-02-17 MED ORDER — HYDROCODONE-HOMATROPINE 5-1.5 MG/5ML PO SYRP: ORAL_SOLUTION | ORAL | Status: DC

## 2013-02-17 MED ORDER — ATENOLOL-CHLORTHALIDONE 50-25 MG PO TABS: ORAL_TABLET | ORAL | Status: DC

## 2013-02-17 NOTE — Progress Notes (Signed)
   Subjective:    Patient ID: Marie Ramirez, female    DOB: 1945-02-25, 68 y.o.   MRN: [id number]  HPI Marie Ramirez is a 68 year old married female nonsmoker who comes in today for a Medicare wellness examination because of a history of hypertension, postmenopausal vaginal dryness, severe degenerative joint disease with pain in both knees. She's followed by Dr. Durward Fortes. She's had steroid injections however she still has a lot of pain and so she can't walk because of the pain.  She gets routine eye care, dental care, BSE monthly, and you mammography, colonoscopy due she'll call GI  Cognitive function normal she can't walk because of knee pain, home health safety reviewed no issues identified, no guns in the house, she does have a health care power of attorney and living well.  Vaccinations updated  She has a rash on her lower extremities right and left leg. The red slightly elevated itching for the past couple months   Review of Systems  Constitutional: Negative.   HENT: Negative.   Eyes: Negative.   Respiratory: Negative.   Cardiovascular: Negative.   Gastrointestinal: Negative.   Genitourinary: Negative.   Musculoskeletal: Negative.   Neurological: Negative.   Psychiatric/Behavioral: Negative.        Objective:   Physical Exam  Nursing note and vitals reviewed. Constitutional: She appears well-developed and well-nourished.  HENT:  Head: Normocephalic and atraumatic.  Right Ear: External ear normal.  Left Ear: External ear normal.  Nose: Nose normal.  Mouth/Throat: Oropharynx is clear and moist.  Eyes: EOM are normal. Pupils are equal, round, and reactive to light.  Neck: Normal range of motion. Neck supple. No thyromegaly present.  Cardiovascular: Normal rate, regular rhythm, normal heart sounds and intact distal pulses.  Exam reveals no gallop and no friction rub.   No murmur heard. No carotid or bruits peripheral pulses 1+ and symmetrical  Pulmonary/Chest: Effort normal and  breath sounds normal.  Abdominal: Soft. Bowel sounds are normal. She exhibits no distension and no mass. There is no tenderness. There is no rebound.  Genitourinary:  Bilateral breast exam normal  Uterus removed many years ago ovaries left intact no GYN complaints therefore pelvic exam not indicated at this juncture. Done last year and normal  Musculoskeletal: Normal range of motion.  Swelling both knees consistent with degenerative cartilage deterioration  Lymphadenopathy:    She has no cervical adenopathy.  Neurological: She is alert. She has normal reflexes. No cranial nerve deficit. She exhibits normal muscle tone. Coordination normal.  Skin: Skin is warm and dry.  Total body skin exam normal  Psychiatric: She has a normal mood and affect. Her behavior is normal. Judgment and thought content normal.          Assessment & Plan:  Healthy female  Hypertension at goal continue Tenoretic one half tab daily  Postmenopausal vaginal dryness Crecy vaginal cream 3 times weekly  Degenerative joint disease followup by Dr. Durward Fortes  Rash on her legs moisturizing cream and triamcinolone gel

## 2013-02-17 NOTE — Progress Notes (Signed)
Pre visit review using our clinic review tool, if applicable. No additional management support is needed unless otherwise documented below in the visit note. 

## 2013-02-17 NOTE — Patient Instructions (Signed)
Use the hormonal cream 3 times weekly  Continue the Tenoretic,,,,,,, one half tab daily for good blood pressure control  Motrin 400 mg twice daily with food for your knee pain  Elevation and ice when necessary  Call Dr. Durward Fortes for reevaluation since she can't walk  Consider [facility] aerobics at the [facility]  Return in one year sooner if any problems  Use small amounts of moisturizing cream and triamcinolone gel twice daily for the rash on your legs

## 2013-02-23 ENCOUNTER — Ambulatory Visit (AMBULATORY_SURGERY_CENTER): Payer: Self-pay | Admitting: *Deleted

## 2013-02-23 VITALS — Ht 63.0 in | Wt 193.0 lb

## 2013-02-23 DIAGNOSIS — Z1211 Encounter for screening for malignant neoplasm of colon: Secondary | ICD-10-CM

## 2013-02-23 MED ORDER — MOVIPREP 100 G PO SOLR: ORAL | Status: DC

## 2013-02-26 ENCOUNTER — Telehealth: Payer: Self-pay | Admitting: Family Medicine

## 2013-02-26 ENCOUNTER — Encounter: Payer: Self-pay | Admitting: *Deleted

## 2013-02-26 ENCOUNTER — Telehealth: Payer: Self-pay | Admitting: Internal Medicine

## 2013-02-26 NOTE — Telephone Encounter (Signed)
Relevant patient education mailed to patient.  

## 2013-02-26 NOTE — Telephone Encounter (Signed)
No answer; will call pt back in am

## 2013-02-27 NOTE — Telephone Encounter (Signed)
Pt will pick up prep at pharmacy as planned

## 2013-03-04 ENCOUNTER — Encounter: Payer: Self-pay | Admitting: Internal Medicine

## 2013-03-04 ENCOUNTER — Ambulatory Visit (AMBULATORY_SURGERY_CENTER): Payer: Medicare Other | Admitting: Internal Medicine

## 2013-03-04 VITALS — BP 124/75 | HR 57 | Temp 97.2°F | Resp 12 | Ht 63.0 in | Wt 193.0 lb

## 2013-03-04 DIAGNOSIS — I10 Essential (primary) hypertension: Secondary | ICD-10-CM | POA: Diagnosis not present

## 2013-03-04 DIAGNOSIS — Z1211 Encounter for screening for malignant neoplasm of colon: Secondary | ICD-10-CM | POA: Diagnosis not present

## 2013-03-04 MED ORDER — SODIUM CHLORIDE 0.9 % IV SOLN: 500.0000 mL | INTRAVENOUS | Status: DC

## 2013-03-04 NOTE — Op Note (Signed)
[place]  [address] [place], [address]   COLONOSCOPY PROCEDURE REPORT  PATIENT: Marie Ramirez, Marie Ramirez  MR#: [id number] BIRTHDATE: 08-18-1945 , 67  yrs. old GENDER: Female ENDOSCOPIST: Lafayette Dragon, MD REFERRED QQ:[id number] Delora Fuel, M.D. PROCEDURE DATE:  03/04/2013 PROCEDURE:   Colonoscopy, screening First Screening Colonoscopy - Avg.  risk and is 50 yrs.  old or older - No.  Prior Negative Screening - Now for repeat screening. 10 or more years since last screening  History of Adenoma - Now for follow-up colonoscopy & has been > or = to 3 yrs.  N/A  Polyps Removed Today? No.  Recommend repeat exam, <10 yrs? No. ASA CLASS:   Class II INDICATIONS:last colonoscopy November 2003 showed moderately severe diverticulosis. MEDICATIONS: MAC sedation, administered by CRNA and propofol (Diprivan) 250mg  IV  DESCRIPTION OF PROCEDURE:   After the risks benefits and alternatives of the procedure were thoroughly explained, informed consent was obtained.  A digital rectal exam revealed no abnormalities of the rectum.   The LB [id number]-H190 [id number]  endoscope was introduced through the anus and advanced to the cecum, which was identified by both the appendix and ileocecal valve. No adverse events experienced.   The quality of the prep was good, using MoviPrep  The instrument was then slowly withdrawn as the colon was fully examined.      COLON FINDINGS: There was moderate diverticulosis noted throughout the entire examined colon with associated muscular hypertrophy. Retroflexed views revealed no abnormalities. The time to cecum=3 minutes 18 seconds.  Withdrawal time=6 minutes 22 seconds.  The scope was withdrawn and the procedure completed. COMPLICATIONS: There were no complications.  ENDOSCOPIC IMPRESSION: There was moderate diverticulosis noted throughout the entire examined colon  RECOMMENDATIONS: high fiber diet Recall colonoscopy in 10 years fiber supplements, Benefiber  1 teaspoon daily   eSigned:  Lafayette Dragon, MD 03/04/2013 10:50 AM   cc:   PATIENT NAME:  [name], [name] MR#: [id number]

## 2013-03-04 NOTE — Patient Instructions (Signed)
YOU HAD AN ENDOSCOPIC PROCEDURE TODAY AT THE [facility]: Refer to the procedure report that was given to you for any specific questions about what was found during the examination.  If the procedure report does not answer your questions, please call your gastroenterologist to clarify.  If you requested that your care partner not be given the details of your procedure findings, then the procedure report has been included in a sealed envelope for you to review at your convenience later.  YOU SHOULD EXPECT: Some feelings of bloating in the abdomen. Passage of more gas than usual.  Walking can help get rid of the air that was put into your GI tract during the procedure and reduce the bloating. If you had a lower endoscopy (such as a colonoscopy or flexible sigmoidoscopy) you may notice spotting of blood in your stool or on the toilet paper. If you underwent a bowel prep for your procedure, then you may not have a normal bowel movement for a few days.  DIET: Your first meal following the procedure should be a light meal and then it is ok to progress to your normal diet.  A half-sandwich or bowl of soup is an example of a good first meal.  Heavy or fried foods are harder to digest and may make you feel nauseous or bloated.  Likewise meals heavy in dairy and vegetables can cause extra gas to form and this can also increase the bloating.  Drink plenty of fluids but you should avoid alcoholic beverages for 24 hours.  ACTIVITY: Your care partner should take you home directly after the procedure.  You should plan to take it easy, moving slowly for the rest of the day.  You can resume normal activity the day after the procedure however you should NOT DRIVE or use heavy machinery for 24 hours (because of the sedation medicines used during the test).    SYMPTOMS TO REPORT IMMEDIATELY: A gastroenterologist can be reached at any hour.  During normal business hours, 8:30 AM to 5:00 PM Monday through Friday,  call [phone].  After hours and on weekends, please call the GI answering service at [phone] who will take a message and have the physician on call contact you.   Following lower endoscopy (colonoscopy or flexible sigmoidoscopy):  Excessive amounts of blood in the stool  Significant tenderness or worsening of abdominal pains  Swelling of the abdomen that is new, acute  Fever of 100F or higher    FOLLOW UP: If any biopsies were taken you will be contacted by phone or by letter within the next 1-3 weeks.  Call your gastroenterologist if you have not heard about the biopsies in 3 weeks.  Our staff will call the home number listed on your records the next business day following your procedure to check on you and address any questions or concerns that you may have at that time regarding the information given to you following your procedure. This is a courtesy call and so if there is no answer at the home number and we have not heard from you through the emergency physician on call, we will assume that you have returned to your regular daily activities without incident.  SIGNATURES/CONFIDENTIALITY: You and/or your care partner have signed paperwork which will be entered into your electronic medical record.  These signatures attest to the fact that that the information above on your After Visit Summary has been reviewed and is understood.  Full responsibility of the confidentiality   of this discharge information lies with you and/or your care-partner.   Information on diverticulosis & high fiber diet given to you today  Fiber supplement daily

## 2013-03-05 ENCOUNTER — Telehealth: Payer: Self-pay

## 2013-03-05 NOTE — Telephone Encounter (Signed)
  Follow up Call-  Call back number 03/04/2013  Post procedure Call Back phone  # [phone]  Permission to leave phone message Yes     Patient questions:  Do you have a fever, pain , or abdominal swelling? no Pain Score  0 *  Have you tolerated food without any problems? yes  Have you been able to return to your normal activities? yes  Do you have any questions about your discharge instructions: Diet   no Medications  no Follow up visit  no  Do you have questions or concerns about your Care? no  Actions: * If pain score is 4 or above: No action needed, pain <4.

## 2013-04-09 DIAGNOSIS — L408 Other psoriasis: Secondary | ICD-10-CM | POA: Diagnosis not present

## 2013-04-09 DIAGNOSIS — D235 Other benign neoplasm of skin of trunk: Secondary | ICD-10-CM | POA: Diagnosis not present

## 2013-04-09 DIAGNOSIS — L821 Other seborrheic keratosis: Secondary | ICD-10-CM | POA: Diagnosis not present

## 2013-08-31 DIAGNOSIS — Z803 Family history of malignant neoplasm of breast: Secondary | ICD-10-CM | POA: Diagnosis not present

## 2013-08-31 DIAGNOSIS — Z1231 Encounter for screening mammogram for malignant neoplasm of breast: Secondary | ICD-10-CM | POA: Diagnosis not present

## 2014-02-24 ENCOUNTER — Ambulatory Visit (INDEPENDENT_AMBULATORY_CARE_PROVIDER_SITE_OTHER): Payer: Medicare Other | Admitting: Family Medicine

## 2014-02-24 ENCOUNTER — Telehealth: Payer: Self-pay | Admitting: *Deleted

## 2014-02-24 ENCOUNTER — Encounter: Payer: Self-pay | Admitting: Family Medicine

## 2014-02-24 VITALS — BP 140/90 | Temp 98.3°F | Ht 63.0 in | Wt 193.0 lb

## 2014-02-24 DIAGNOSIS — J302 Other seasonal allergic rhinitis: Secondary | ICD-10-CM

## 2014-02-24 DIAGNOSIS — Z23 Encounter for immunization: Secondary | ICD-10-CM | POA: Diagnosis not present

## 2014-02-24 DIAGNOSIS — N952 Postmenopausal atrophic vaginitis: Secondary | ICD-10-CM | POA: Diagnosis not present

## 2014-02-24 DIAGNOSIS — R21 Rash and other nonspecific skin eruption: Secondary | ICD-10-CM | POA: Diagnosis not present

## 2014-02-24 DIAGNOSIS — Z0000 Encounter for general adult medical examination without abnormal findings: Secondary | ICD-10-CM

## 2014-02-24 DIAGNOSIS — N079 Hereditary nephropathy, not elsewhere classified with unspecified morphologic lesions: Secondary | ICD-10-CM | POA: Diagnosis not present

## 2014-02-24 DIAGNOSIS — E669 Obesity, unspecified: Secondary | ICD-10-CM

## 2014-02-24 DIAGNOSIS — M25559 Pain in unspecified hip: Secondary | ICD-10-CM

## 2014-02-24 DIAGNOSIS — N029 Recurrent and persistent hematuria with unspecified morphologic changes: Secondary | ICD-10-CM

## 2014-02-24 DIAGNOSIS — I10 Essential (primary) hypertension: Secondary | ICD-10-CM | POA: Diagnosis not present

## 2014-02-24 LAB — POCT URINALYSIS DIPSTICK
Bilirubin, UA: NEGATIVE
GLUCOSE UA: NEGATIVE
Ketones, UA: NEGATIVE
NITRITE UA: NEGATIVE
PH UA: 6
Protein, UA: NEGATIVE
Spec Grav, UA: 1.01
Urobilinogen, UA: 0.2

## 2014-02-24 LAB — CBC WITH DIFFERENTIAL/PLATELET
BASOS ABS: 0.1 10*3/uL (ref 0.0–0.1)
BASOS PCT: 0.6 % (ref 0.0–3.0)
EOS ABS: 0.2 10*3/uL (ref 0.0–0.7)
Eosinophils Relative: 2.5 % (ref 0.0–5.0)
HCT: 44.3 % (ref 36.0–46.0)
Hemoglobin: 14.9 g/dL (ref 12.0–15.0)
Lymphocytes Relative: 22.8 % (ref 12.0–46.0)
Lymphs Abs: 2.2 10*3/uL (ref 0.7–4.0)
MCHC: 33.5 g/dL (ref 30.0–36.0)
MCV: 86.2 fl (ref 78.0–100.0)
Monocytes Absolute: 0.8 10*3/uL (ref 0.1–1.0)
Monocytes Relative: 8.6 % (ref 3.0–12.0)
NEUTROS ABS: 6.4 10*3/uL (ref 1.4–7.7)
NEUTROS PCT: 65.5 % (ref 43.0–77.0)
Platelets: 450 10*3/uL — ABNORMAL HIGH (ref 150.0–400.0)
RBC: 5.14 Mil/uL — ABNORMAL HIGH (ref 3.87–5.11)
RDW: 13.2 % (ref 11.5–15.5)
WBC: 9.8 10*3/uL (ref 4.0–10.5)

## 2014-02-24 LAB — TSH: TSH: 2.38 u[IU]/mL (ref 0.35–4.50)

## 2014-02-24 LAB — BASIC METABOLIC PANEL
BUN: 20 mg/dL (ref 6–23)
CHLORIDE: 106 meq/L (ref 96–112)
CO2: 31 mEq/L (ref 19–32)
CREATININE: 1.13 mg/dL (ref 0.40–1.20)
Calcium: 9.9 mg/dL (ref 8.4–10.5)
GFR: 51.03 mL/min — AB (ref >60.00)
Glucose, Bld: 90 mg/dL (ref 70–99)
Potassium: 4.5 mEq/L (ref 3.5–5.1)
Sodium: 146 mEq/L — ABNORMAL HIGH (ref 135–145)

## 2014-02-24 MED ORDER — PREDNISONE 20 MG PO TABS: ORAL_TABLET | ORAL | Status: DC

## 2014-02-24 MED ORDER — ATENOLOL-CHLORTHALIDONE 50-25 MG PO TABS: ORAL_TABLET | ORAL | Status: DC

## 2014-02-24 NOTE — Telephone Encounter (Signed)
Patient would like to know which nasal spray should she be using? [facility] [place]

## 2014-02-24 NOTE — Progress Notes (Signed)
   Subjective:    Patient ID: Marie Ramirez, female    DOB: 08/04/1945, 69 y.o.   MRN: [id number]  HPI  Marie Ramirez is a 69 year old married female nonsmoker who comes in today for general physical examination because of a history of hypertension, allergic rhinitis, postmenopausal vaginal dryness  She takes Tenoretic 50-25 dose one half tab daily BP 140/90  Uses Zyrtec but is not helping. We'll switch to Allegra and steroid nasal spray  Uses Premarin cream sporadically for vaginal dryness  She gets routine eye care, dental care, BSE monthly, and you mammography, colonoscopy 2015 normal.  She saw Dr. Nevada Crane her dermatologist last year because of psoriasis. He has her on a steroid cream.  Cognitive function normal she does not walk daily home health safety reviewed no issues identified, no guns in the house, she does not have a healthcare power of attorney nor living well,,,,,,,, recommended she do so  Vaccinations up-to-date except for a Pneumovax 13 which will be given today   Review of Systems  Constitutional: Negative.   HENT: Negative.   Eyes: Negative.   Respiratory: Negative.   Cardiovascular: Negative.   Gastrointestinal: Negative.   Endocrine: Negative.   Genitourinary: Negative.   Musculoskeletal: Negative.   Skin: Negative.   Allergic/Immunologic: Negative.   Neurological: Negative.   Hematological: Negative.   Psychiatric/Behavioral: Negative.        Objective:   Physical Exam  Constitutional: She appears well-developed and well-nourished.  HENT:  Head: Normocephalic and atraumatic.  Right Ear: External ear normal.  Left Ear: External ear normal.  Nose: Nose normal.  Mouth/Throat: Oropharynx is clear and moist.  Eyes: EOM are normal. Pupils are equal, round, and reactive to light.  Neck: Normal range of motion. Neck supple. No JVD present. No tracheal deviation present. No thyromegaly present.  Cardiovascular: Normal rate, regular rhythm, normal heart sounds and  intact distal pulses.  Exam reveals no gallop and no friction rub.   No murmur heard. No carotid bruits  Pulmonary/Chest: Effort normal and breath sounds normal. No stridor. No respiratory distress. She has no wheezes. She has no rales. She exhibits no tenderness.  Abdominal: Soft. Bowel sounds are normal. She exhibits no distension and no mass. There is no tenderness. There is no rebound and no guarding.  Genitourinary:    2 years ago was normal therefore will do Pap every 3 years which will be next year. No GYN complaints Bilateral breast exam normal  Musculoskeletal: Normal range of motion.  Lymphadenopathy:    She has no cervical adenopathy.  Neurological: She is alert. She has normal reflexes. No cranial nerve deficit. She exhibits normal muscle tone. Coordination normal.  Skin: Skin is warm and dry. No rash noted. No erythema. No pallor.  Total body skin exam normal  Psychiatric: She has a normal mood and affect. Her behavior is normal. Judgment and thought content normal.  Nursing note and vitals reviewed.         Assessment & Plan:  Healthy female  Mild hypertension controlled with Tenoretic dose one half tab daily  Allergic rhinitis switch to Allegra and steroid nasal spray  Postmenopausal vaginal dryness small amounts of the Premarin vaginal cream twice weekly when necessary  History of psoriasis followed by Dr. Nevada Crane in dermatology  History of hematuria........Marland Kitchen workup negative

## 2014-02-24 NOTE — Patient Instructions (Signed)
Continue current medications except switch to the Allegra and steroid nasal spray stop the Zyrtec. If in 2-3 weeks you don't see any improvement we'll add a short course of prednisone  Walk 30 minutes daily  Bone density  Follow-up in one year sooner if any problems

## 2014-02-24 NOTE — Progress Notes (Signed)
Pre visit review using our clinic review tool, if applicable. No additional management support is needed unless otherwise documented below in the visit note. 

## 2014-02-25 MED ORDER — FLUTICASONE PROPIONATE 50 MCG/ACT NA SUSP: 2.0000 | Freq: Every day | NASAL | Status: DC

## 2014-02-25 NOTE — Telephone Encounter (Signed)
Rx sent per Dr Sherren Mocha and patient is aware.

## 2014-03-03 ENCOUNTER — Inpatient Hospital Stay: Admission: RE | Admit: 2014-03-03 | Payer: Medicare Other | Source: Ambulatory Visit

## 2014-03-10 ENCOUNTER — Ambulatory Visit (INDEPENDENT_AMBULATORY_CARE_PROVIDER_SITE_OTHER)
Admission: RE | Admit: 2014-03-10 | Discharge: 2014-03-10 | Disposition: A | Discharge status: Home / Self Care | Payer: Medicare Other | Source: Ambulatory Visit | Attending: Family Medicine | Admitting: Family Medicine

## 2014-03-10 DIAGNOSIS — N952 Postmenopausal atrophic vaginitis: Secondary | ICD-10-CM | POA: Diagnosis not present

## 2014-03-10 DIAGNOSIS — Z13820 Encounter for screening for osteoporosis: Secondary | ICD-10-CM

## 2014-03-15 ENCOUNTER — Encounter: Payer: Self-pay | Admitting: *Deleted

## 2014-03-15 DIAGNOSIS — M8580 Other specified disorders of bone density and structure, unspecified site: Secondary | ICD-10-CM | POA: Insufficient documentation

## 2014-09-15 DIAGNOSIS — Z1231 Encounter for screening mammogram for malignant neoplasm of breast: Secondary | ICD-10-CM | POA: Diagnosis not present

## 2014-09-20 DIAGNOSIS — N6001 Solitary cyst of right breast: Secondary | ICD-10-CM | POA: Diagnosis not present

## 2014-09-20 DIAGNOSIS — R928 Other abnormal and inconclusive findings on diagnostic imaging of breast: Secondary | ICD-10-CM | POA: Diagnosis not present

## 2014-09-20 LAB — HM MAMMOGRAPHY

## 2014-09-22 ENCOUNTER — Encounter: Payer: Self-pay | Admitting: Family Medicine

## 2014-09-28 ENCOUNTER — Encounter: Payer: Self-pay | Admitting: Family Medicine

## 2015-02-18 ENCOUNTER — Other Ambulatory Visit: Payer: Self-pay | Admitting: Family Medicine

## 2015-03-11 ENCOUNTER — Other Ambulatory Visit: Payer: Self-pay | Admitting: Family Medicine

## 2015-05-09 ENCOUNTER — Ambulatory Visit (INDEPENDENT_AMBULATORY_CARE_PROVIDER_SITE_OTHER): Payer: Medicare Other | Admitting: Family Medicine

## 2015-05-09 ENCOUNTER — Encounter: Payer: Self-pay | Admitting: Family Medicine

## 2015-05-09 VITALS — BP 130/80 | Temp 98.3°F | Wt 195.0 lb

## 2015-05-09 DIAGNOSIS — K219 Gastro-esophageal reflux disease without esophagitis: Secondary | ICD-10-CM | POA: Insufficient documentation

## 2015-05-09 DIAGNOSIS — K2100 Gastro-esophageal reflux disease with esophagitis, without bleeding: Secondary | ICD-10-CM

## 2015-05-09 DIAGNOSIS — I10 Essential (primary) hypertension: Secondary | ICD-10-CM

## 2015-05-09 DIAGNOSIS — N952 Postmenopausal atrophic vaginitis: Secondary | ICD-10-CM

## 2015-05-09 MED ORDER — ESTROGENS, CONJUGATED 0.625 MG/GM VA CREA: 1.0000 | TOPICAL_CREAM | Freq: Every day | VAGINAL | Status: DC

## 2015-05-09 MED ORDER — FLUTICASONE PROPIONATE 50 MCG/ACT NA SUSP: NASAL | Status: DC

## 2015-05-09 MED ORDER — ATENOLOL-CHLORTHALIDONE 50-25 MG PO TABS: ORAL_TABLET | ORAL | Status: DC

## 2015-05-09 MED ORDER — OMEPRAZOLE 20 MG PO CPDR: 20.0000 mg | DELAYED_RELEASE_CAPSULE | Freq: Every day | ORAL | Status: DC

## 2015-05-09 NOTE — Patient Instructions (Signed)
Prilosec 20 mg........ one before breakfast one before your evening female  Sleep on 2 pillows  Nothing to eat or drink for 3 hours before bedtime  We will get you set up for a GI consult for further evaluation  Continue your other medications  The Premarin cream,,,,,,,,, [email or web address]

## 2015-05-09 NOTE — Progress Notes (Signed)
Pre visit review using our clinic review tool, if applicable. No additional management support is needed unless otherwise documented below in the visit note. 

## 2015-05-09 NOTE — Progress Notes (Signed)
   Subjective:    Patient ID: Noah Delaine, female    DOB: 01-21-1946, 70 y.o.   MRN: [id number]  HPI Merikay is a 70 year old married female nonsmoker who comes in today for evaluation of reflux esophagitis and hypertension  Last summer she began having symptoms of reflux esophagitis. The as the occur around 6 or 7 in the evening. It's about the same time that she eats. She has not used tobacco, alcohol, peppermint. She only drinks 2 cups of caffeinated coffee a day no tea. She's been treating her tells himself with over-the-counter Zantac 150 mg daily which does help a lot. She denies any history of difficulty swallowing except for some time she has this feeling of something funny in her lower esophagus. Also interestingly enough she's never had symptoms of reflux before. She denies abdominal pain.  She takes Tenoretic 50-25 one half tab daily for hypertension Zyrtec and Flonase for allergic rhinitis Premarin vaginal cream for postmenopausal vaginal dryness. She's due to see Dr. Yong Channel next January for physical and to get established. Her husband sees Dr. Yong Channel this summer   Review of Systems Review of systems otherwise negative    Objective:   Physical Exam  Well-developed well-nourished female no acute distress vital signs stable she's afebrile      Assessment & Plan:  Hypertension at goal,,,,,,, continue current therapy  Allergic rhinitis,,,,,,,,, continue current therapy  Postmenopausal vaginal dryness,,,,,,,, continue Premarin cream  Reflux esophagitis,,,,,,,,,, atypical symptoms,,,,,,,,,,, Prilosec 20 twice a day,,,,,,,,,, GI consult

## 2015-05-19 DIAGNOSIS — L820 Inflamed seborrheic keratosis: Secondary | ICD-10-CM | POA: Diagnosis not present

## 2015-05-19 DIAGNOSIS — D225 Melanocytic nevi of trunk: Secondary | ICD-10-CM | POA: Diagnosis not present

## 2015-05-31 ENCOUNTER — Other Ambulatory Visit: Payer: Self-pay | Admitting: Family Medicine

## 2015-05-31 ENCOUNTER — Ambulatory Visit (INDEPENDENT_AMBULATORY_CARE_PROVIDER_SITE_OTHER): Payer: Medicare Other | Admitting: Gastroenterology

## 2015-05-31 ENCOUNTER — Encounter: Payer: Self-pay | Admitting: Gastroenterology

## 2015-05-31 VITALS — BP 130/90 | HR 68 | Ht 63.0 in | Wt 199.0 lb

## 2015-05-31 DIAGNOSIS — K219 Gastro-esophageal reflux disease without esophagitis: Secondary | ICD-10-CM | POA: Diagnosis not present

## 2015-05-31 NOTE — Progress Notes (Signed)
[facility] Progress Note  Chief Complaint: GERD  Subjective History:  This is a new patient to me, referred by her PCP for several months of GERD symptoms. She previously saw Dr. Olevia Perches for a colonoscopy in February 2015 that had diverticulosis but no polyps. And complains of intermittent pyrosis and regurgitation, often when bending over while she is working in the garden. She seems to have a lot of burping in the morning, she has pyrosis and a sour taste in the throat. She had no improvement with Zantac, air was some improvement on once daily Prilosec, but she is much better on twice daily Prilosec. She denies dysphagia odynophagia or early satiety or weight loss. She is also getting some weight over the last 6-12 months. ROS: Cardiovascular:  no chest pain Respiratory: no dyspnea  The patient's Past Medical, Family and Social History were reviewed and are on file in the EMR.  Objective:  Med list reviewed  Vital signs in last 24 hrs: Filed Vitals:   05/31/15 1037  BP: 130/90  Pulse: 68    Physical Exam   HEENT: sclera anicteric, oral mucosa moist without lesions  Neck: supple, no thyromegaly, JVD or lymphadenopathy  Cardiac: RRR without murmurs, S1S2 heard, no peripheral edema  Pulm: clear to auscultation bilaterally, normal RR and effort noted  Abdomen: soft, No tenderness, with active bowel sounds. No guarding or palpable hepatosplenomegaly.  Skin; warm and dry, no jaundice or rash    @ASSESSMENTPLANBEGIN @ Assessment: Encounter Diagnosis  Name Primary?  . Gastroesophageal reflux disease, esophagitis presence not specified Yes   She has no red flag symptoms, but her symptoms have worsened in the last few months and he required twice daily PPI therapy. We discussed GERD and its therapies including both medicines as well as diet and lifestyle changes. Written information was given.   Plan: EGD is planned to assess for esophagitis and possible hiatal  hernia.  The benefits and risks of the planned procedure were described in detail with the patient or (when appropriate) their health care proxy.  Risks were outlined as including, but not limited to, bleeding, infection, perforation, adverse medication reaction leading to cardiac or pulmonary decompensation, or pancreatitis (if ERCP).  The limitation of incomplete mucosal visualization was also discussed.  No guarantees or warranties were given.   Nelida Meuse III

## 2015-05-31 NOTE — Patient Instructions (Signed)
You have been scheduled for an endoscopy. Please follow written instructions given to you at your visit today. If you use inhalers (even only as needed), please bring them with you on the day of your procedure. Your physician has requested that you go to www.startemmi.com and enter the access code given to you at your visit today. This web site gives a general overview about your procedure. However, you should still follow specific instructions given to you by our office regarding your preparation for the procedure.  If you are age 61 or older, your body mass index should be between 23-30. Your Body mass index is 35.26 kg/(m^2). If this is out of the aforementioned range listed, please consider follow up with your Primary Care Provider.  If you are age 39 or younger, your body mass index should be between 19-25. Your Body mass index is 35.26 kg/(m^2). If this is out of the aformentioned range listed, please consider follow up with your Primary Care Provider.   Thank you for choosing [facility]  Dr Wilfrid Lund III

## 2015-06-02 ENCOUNTER — Encounter: Payer: Self-pay | Admitting: Gastroenterology

## 2015-06-02 ENCOUNTER — Ambulatory Visit (AMBULATORY_SURGERY_CENTER): Payer: Medicare Other | Admitting: Gastroenterology

## 2015-06-02 VITALS — BP 107/68 | HR 57 | Temp 98.2°F | Resp 9 | Ht 63.0 in | Wt 199.0 lb

## 2015-06-02 DIAGNOSIS — K219 Gastro-esophageal reflux disease without esophagitis: Secondary | ICD-10-CM | POA: Diagnosis not present

## 2015-06-02 DIAGNOSIS — E669 Obesity, unspecified: Secondary | ICD-10-CM | POA: Diagnosis not present

## 2015-06-02 DIAGNOSIS — I10 Essential (primary) hypertension: Secondary | ICD-10-CM | POA: Diagnosis not present

## 2015-06-02 MED ORDER — SODIUM CHLORIDE 0.9 % IV SOLN: 500.0000 mL | INTRAVENOUS | Status: DC

## 2015-06-02 NOTE — Op Note (Signed)
[facility] Patient Name: Marie Ramirez Procedure Date: 06/02/2015 8:57 AM MRN: [id number] Endoscopist: Marie Ramirez , MD Age: 70 Date of Birth: 05-31-1945 Gender: Female Procedure:                Upper GI endoscopy Indications:              Esophageal reflux symptoms that persist despite                            appropriate therapy Medicines:                Monitored Anesthesia Care Procedure:                Pre-Anesthesia Assessment:                           - Prior to the procedure, a History and Physical                            was performed, and patient medications and                            allergies were reviewed. The patient's tolerance of                            previous anesthesia was also reviewed. The risks                            and benefits of the procedure and the sedation                            options and risks were discussed with the patient.                            All questions were answered, and informed consent                            was obtained. Prior Anticoagulants: The patient has                            taken no previous anticoagulant or antiplatelet                            agents. ASA Grade Assessment: II - A patient with                            mild systemic disease. After reviewing the risks                            and benefits, the patient was deemed in                            satisfactory condition to undergo the procedure.  After obtaining informed consent, the endoscope was                            passed under direct vision. Throughout the                            procedure, the patient's blood pressure, pulse, and                            oxygen saturations were monitored continuously. The                            Model GIF-[id number]) scope was introduced                            through the mouth, and advanced to the second part                             of duodenum. The upper GI endoscopy was                            accomplished without difficulty. The patient                            tolerated the procedure well. Scope In: Scope Out: Findings:                 The esophagus was normal.                           The stomach was normal.                           The examined duodenum was normal.                           The cardia and gastric fundus were normal on                            retroflexion. Complications:            No immediate complications. Estimated Blood Loss:     Estimated blood loss: none. Impression:               - Normal esophagus.                           - Normal stomach.                           - Normal examined duodenum.                           - No specimens collected. Recommendation:           - Patient has a contact number available for  emergencies. The signs and symptoms of potential                            delayed complications were discussed with the                            patient. Return to normal activities tomorrow.                            Written discharge instructions were provided to the                            patient.                           - Resume previous diet.                           - Continue present medications.                           - Continue omeprazole 20 mg once daily before                            breakfast and supper for 4 weeks, then decrease to                            once daily before breakfast for 4 weeks, then                            change to ranitidine (zantac) or famotidine                            (pepcid) as needed.                           Written information regarding diet and lifestyle                            measures for GERD were provided.                           Follow up in GI clinic as needed. Marie Selway L. [name], MD 06/02/2015 9:12:25 AM This report has been signed electronically.

## 2015-06-02 NOTE — Patient Instructions (Signed)
Discharge instructions given. Normal exam. Resume previous medications. YOU HAD AN ENDOSCOPIC PROCEDURE TODAY AT THE [facility]:   Refer to the procedure report that was given to you for any specific questions about what was found during the examination.  If the procedure report does not answer your questions, please call your gastroenterologist to clarify.  If you requested that your care partner not be given the details of your procedure findings, then the procedure report has been included in a sealed envelope for you to review at your convenience later.  YOU SHOULD EXPECT: Some feelings of bloating in the abdomen. Passage of more gas than usual.  Walking can help get rid of the air that was put into your GI tract during the procedure and reduce the bloating. If you had a lower endoscopy (such as a colonoscopy or flexible sigmoidoscopy) you may notice spotting of blood in your stool or on the toilet paper. If you underwent a bowel prep for your procedure, you may not have a normal bowel movement for a few days.  Please Note:  You might notice some irritation and congestion in your nose or some drainage.  This is from the oxygen used during your procedure.  There is no need for concern and it should clear up in a day or so.  SYMPTOMS TO REPORT IMMEDIATELY:    Following upper endoscopy (EGD)  Vomiting of blood or coffee ground material  New chest pain or pain under the shoulder blades  Painful or persistently difficult swallowing  New shortness of breath  Fever of 100F or higher  Black, tarry-looking stools  For urgent or emergent issues, a gastroenterologist can be reached at any hour by calling [phone].   DIET: Your first meal following the procedure should be a small meal and then it is ok to progress to your normal diet. Heavy or fried foods are harder to digest and may make you feel nauseous or bloated.  Likewise, meals heavy in dairy and vegetables can increase  bloating.  Drink plenty of fluids but you should avoid alcoholic beverages for 24 hours.  ACTIVITY:  You should plan to take it easy for the rest of today and you should NOT DRIVE or use heavy machinery until tomorrow (because of the sedation medicines used during the test).    FOLLOW UP: Our staff will call the number listed on your records the next business day following your procedure to check on you and address any questions or concerns that you may have regarding the information given to you following your procedure. If we do not reach you, we will leave a message.  However, if you are feeling well and you are not experiencing any problems, there is no need to return our call.  We will assume that you have returned to your regular daily activities without incident.  If any biopsies were taken you will be contacted by phone or by letter within the next 1-3 weeks.  Please call us at [phone] if you have not heard about the biopsies in 3 weeks.    SIGNATURES/CONFIDENTIALITY: You and/or your care partner have signed paperwork which will be entered into your electronic medical record.  These signatures attest to the fact that that the information above on your After Visit Summary has been reviewed and is understood.  Full responsibility of the confidentiality of this discharge information lies with you and/or your care-partner. 

## 2015-06-02 NOTE — Progress Notes (Signed)
Stable to RR 

## 2015-06-03 ENCOUNTER — Telehealth: Payer: Self-pay | Admitting: *Deleted

## 2015-06-03 NOTE — Telephone Encounter (Signed)
  Follow up Call-  Call back number 06/02/2015 03/04/2013  Post procedure Call Back phone  # [phone]  Permission to leave phone message Yes Yes     Patient questions:  Do you have a fever, pain , or abdominal swelling? No. Pain Score  0 *  Have you tolerated food without any problems? Yes.    Have you been able to return to your normal activities? Yes.    Do you have any questions about your discharge instructions: Diet   No. Medications  No. Follow up visit  No.  Do you have questions or concerns about your Care? No.  Actions: * If pain score is 4 or above: No action needed, pain <4.

## 2015-06-15 ENCOUNTER — Encounter: Payer: Medicare Other | Admitting: Family Medicine

## 2015-09-21 ENCOUNTER — Other Ambulatory Visit: Payer: Self-pay

## 2015-09-29 DIAGNOSIS — Z803 Family history of malignant neoplasm of breast: Secondary | ICD-10-CM | POA: Diagnosis not present

## 2015-09-29 DIAGNOSIS — Z1231 Encounter for screening mammogram for malignant neoplasm of breast: Secondary | ICD-10-CM | POA: Diagnosis not present

## 2015-09-29 LAB — HM MAMMOGRAPHY

## 2015-10-06 ENCOUNTER — Encounter: Payer: Self-pay | Admitting: Family Medicine

## 2015-12-28 ENCOUNTER — Encounter (INDEPENDENT_AMBULATORY_CARE_PROVIDER_SITE_OTHER): Payer: Self-pay | Admitting: Orthopaedic Surgery

## 2015-12-28 ENCOUNTER — Ambulatory Visit (INDEPENDENT_AMBULATORY_CARE_PROVIDER_SITE_OTHER): Payer: Medicare Other

## 2015-12-28 ENCOUNTER — Ambulatory Visit (INDEPENDENT_AMBULATORY_CARE_PROVIDER_SITE_OTHER): Payer: Medicare Other | Admitting: Orthopaedic Surgery

## 2015-12-28 VITALS — BP 140/79 | HR 59 | Resp 14 | Ht 63.0 in | Wt 190.0 lb

## 2015-12-28 DIAGNOSIS — M79671 Pain in right foot: Secondary | ICD-10-CM | POA: Diagnosis not present

## 2015-12-28 DIAGNOSIS — M25561 Pain in right knee: Secondary | ICD-10-CM | POA: Diagnosis not present

## 2015-12-28 DIAGNOSIS — G8929 Other chronic pain: Secondary | ICD-10-CM

## 2015-12-28 NOTE — Progress Notes (Signed)
Pt complaining of Right knee pain  Difficult going up stairs  Injected 08/27/2012- helped moderate DJD joint Marie Ramirez has been seen in the office previously (2014) for evaluation of right knee pain. X-rays at that time reveal some mild degenerative changes and she responded very nicely to an intra-articular cortisone injection. She's had some recent recurrence of that pain without injury or trauma over the past month.  She's had some achiness and soreness to the knee with some medial joint pain. She's tried heat and ibuprofen. She also has been experiencing pain on the plantar aspect of her right foot beneath the metatarsal heads. She's tried comfortable showing but continues to have pain. She has a prior diagnosis of plantar fasciitis with surgery.  On exam there was a minimal effusion of her right knee with mild medial joint pain. There was some patella crepitation. No evidence of instability. Full extension and flexion beyond 105.  Right foot exam reveals some very minimal tenderness beneath the metatarsal heads of the great second and third toes. There is no callus. There was no bunion formation or hammering of the toes. No swelling neurovascular exam was intact  Marie Ramirez has recurrent symptoms of osteoarthritis right knee I will plan on injecting her knee and monitor her response. In terms of her right foot she has metatarsalgia. We'll try a full arch support. These were inserted and she felt better. Plan to see her back in a when necessary basis.

## 2016-01-03 ENCOUNTER — Ambulatory Visit (INDEPENDENT_AMBULATORY_CARE_PROVIDER_SITE_OTHER): Payer: Medicare Other | Admitting: Podiatry

## 2016-01-03 ENCOUNTER — Ambulatory Visit (INDEPENDENT_AMBULATORY_CARE_PROVIDER_SITE_OTHER): Payer: Medicare Other

## 2016-01-03 ENCOUNTER — Encounter: Payer: Self-pay | Admitting: Podiatry

## 2016-01-03 VITALS — BP 159/82 | HR 64 | Resp 16 | Ht 63.0 in | Wt 190.0 lb

## 2016-01-03 DIAGNOSIS — M778 Other enthesopathies, not elsewhere classified: Secondary | ICD-10-CM

## 2016-01-03 DIAGNOSIS — M7751 Other enthesopathy of right foot: Secondary | ICD-10-CM

## 2016-01-03 DIAGNOSIS — M779 Enthesopathy, unspecified: Principal | ICD-10-CM

## 2016-01-03 NOTE — Progress Notes (Signed)
   Subjective:    Patient ID: Marie Ramirez, female    DOB: [birth year], 70 y.o.   MRN: [id number]  HPI: She presents today after having not seen her for many years with a chief complaint of pain numbness and radiating electrical type pains at the end of her second toe and beneath the second metatarsophalangeal joint. She states that the pain starts in the second toe and radiates into the ball of foot. She states that is stinging and burninggoing on since August of this year. She denies any trauma but does relate that she has been in flip-flops most of the summer.    Review of Systems  HENT: Positive for sinus pressure.   Musculoskeletal: Positive for arthralgias and gait problem.  All other systems reviewed and are negative.      Objective:   Physical Exam: Vital signs are stable she is alert and oriented 3. Pulses are palpable. Neurologic sensorium is intact. Deep tendon reflexes are intact. Muscle strength +5 over 5 dorsiflexion plantar flexors and inverters everters alter the musculature is intact. Orthopedic evaluation demonstrates all joints distal to the ankle for range of motion without crepitation that she does have pain on end range of motion in both dorsiflexion and plantarflexion of the second metatarsophalangeal joint of the right foot with mild overlying edema. There is some mild medial deviation of the second toe noted on physical exam and most notably visualized on radiographs taken today. Radiographs 3 views right foot taken in the office today demonstrates an osseously mature individual with no fractures identified. She does have an elongated second metatarsal with medial deviation of the toe and early dislocation syndrome. Lateral view does demonstrate a hammertoe deformity starting. Cutaneous evaluation demonstrates supple well-hydrated cutis no open lesions or wounds.     Assessment & Plan:  Capsulitis second metatarsophalangeal joint of the right foot. Hammertoe deformity  second digit right foot.  Plan: Injected peri-articularly with Kenalog and local anesthetic today secondary metatarsophalangeal joint of the right foot placed her in a Darco shoe will follow up with her in 3-4 weeks for intra-articular injection if necessary.

## 2016-01-26 ENCOUNTER — Encounter: Payer: Self-pay | Admitting: Podiatry

## 2016-01-26 ENCOUNTER — Ambulatory Visit (INDEPENDENT_AMBULATORY_CARE_PROVIDER_SITE_OTHER): Payer: Medicare Other | Admitting: Podiatry

## 2016-01-26 DIAGNOSIS — M778 Other enthesopathies, not elsewhere classified: Secondary | ICD-10-CM

## 2016-01-26 DIAGNOSIS — M779 Enthesopathy, unspecified: Principal | ICD-10-CM

## 2016-01-26 DIAGNOSIS — M7751 Other enthesopathy of right foot: Secondary | ICD-10-CM

## 2016-01-26 NOTE — Progress Notes (Signed)
She presents today for follow-up of her capsulitis second metatarsophalangeal joint of her right foot. She states it is just doing wonderful. She states that she loves wearing the surgical shoe it feels so much better.  Objective: Vital signs are stable she's alert and oriented 3 no erythema edema cellulitis drainage or odor. She has no pain on palpation or range of motion of the second metatarsophalangeal joint right foot.  Assessment: Well resolved capsulitis second metatarsophalangeal joint right.  Plan: Discussed etiology pathology conservative versus surgical therapies. Discussed appropriate shoe gear stretching exercises ice therapy shoe modifications. I recommend that she get back into her stiff soled shoes not to go barefoot or wear flip-flops.

## 2016-02-20 ENCOUNTER — Encounter: Payer: Self-pay | Admitting: Family Medicine

## 2016-02-20 ENCOUNTER — Ambulatory Visit (INDEPENDENT_AMBULATORY_CARE_PROVIDER_SITE_OTHER): Payer: Medicare Other | Admitting: Family Medicine

## 2016-02-20 ENCOUNTER — Ambulatory Visit (INDEPENDENT_AMBULATORY_CARE_PROVIDER_SITE_OTHER)
Admission: RE | Admit: 2016-02-20 | Discharge: 2016-02-20 | Disposition: A | Discharge status: Home / Self Care | Payer: Medicare Other | Source: Ambulatory Visit | Attending: Family Medicine | Admitting: Family Medicine

## 2016-02-20 VITALS — BP 138/74 | HR 65 | Temp 98.2°F | Ht 63.25 in | Wt 194.0 lb

## 2016-02-20 DIAGNOSIS — R05 Cough: Secondary | ICD-10-CM | POA: Diagnosis not present

## 2016-02-20 DIAGNOSIS — E6609 Other obesity due to excess calories: Secondary | ICD-10-CM

## 2016-02-20 DIAGNOSIS — R319 Hematuria, unspecified: Secondary | ICD-10-CM

## 2016-02-20 DIAGNOSIS — Z7289 Other problems related to lifestyle: Secondary | ICD-10-CM

## 2016-02-20 DIAGNOSIS — I10 Essential (primary) hypertension: Secondary | ICD-10-CM | POA: Diagnosis not present

## 2016-02-20 DIAGNOSIS — K219 Gastro-esophageal reflux disease without esophagitis: Secondary | ICD-10-CM

## 2016-02-20 DIAGNOSIS — M8588 Other specified disorders of bone density and structure, other site: Secondary | ICD-10-CM | POA: Diagnosis not present

## 2016-02-20 DIAGNOSIS — R053 Chronic cough: Secondary | ICD-10-CM

## 2016-02-20 DIAGNOSIS — N952 Postmenopausal atrophic vaginitis: Secondary | ICD-10-CM

## 2016-02-20 DIAGNOSIS — M1712 Unilateral primary osteoarthritis, left knee: Secondary | ICD-10-CM

## 2016-02-20 NOTE — Patient Instructions (Addendum)
Sign release of information at the check out desk for records from [facility] to help determine if further workup needed for blood in the urine  I would also like for you to sign up for an annual wellness visit with our nurse, Manuela Schwartz, who specializes in the annual wellness exam. This is a free benefit under medicare that may help [place] find additional ways to help you. Some highlights are reviewing medications, lifestyle, and doing a dementia screen.  If you are feeling better from lung perspective- plan on prevnar 13 at that point. Possible to have similar illness to last itme but this is a different vaccine so hopefuly not  did well with prednisone when had similar episode years agoand will call me if she becomes ready for this  Schedule a lab visit at the check out desk within 2 weeks. Return for future fasting labs meaning nothing but water after midnight please. Ok to take your medications with water.

## 2016-02-20 NOTE — Addendum Note (Signed)
Addended by: [name] on: 02/20/2016 09:39 AM   Modules accepted: Orders

## 2016-02-20 NOTE — Progress Notes (Addendum)
Subjective:  Marie Ramirez is a 71 y.o. year old very pleasant female patient who presents for/with See problem oriented charting ROS- chronic cough but no shortness of breath. No fever or chills. No nasal discharge or sinus pain   Past Medical History-  Patient Active Problem List   Diagnosis Date Noted  . GERD (gastroesophageal reflux disease) 05/09/2015    Priority: Medium  . Osteopenia 03/15/2014    Priority: Medium  . Vaginitis, atrophic 02/24/2014    Priority: Medium  . Hematuria 12/17/2006    Priority: Medium  . Essential hypertension 09/06/2006    Priority: Medium  . Osteoarthritis of left knee 02/20/2016    Priority: Low  . Obesity 05/31/2011    Priority: Low  . Allergic rhinitis 09/06/2006    Priority: Low    Medications- reviewed and updated Current Outpatient Prescriptions  Medication Sig Dispense Refill  . aspirin 81 MG tablet Take 81 mg by mouth daily.    Marland Kitchen atenolol-chlorthalidone (TENORETIC) 50-25 MG tablet TAKE 1/2 TABLET BY MOUTH EVERY MORNING FOR HIGH BLOOD PRESSURE 100 tablet 2  . calcium citrate-vitamin D (CITRACAL+D) 315-200 MG-UNIT per tablet Take 1 tablet by mouth daily.    . fexofenadine (ALLEGRA) 180 MG tablet Take 180 mg by mouth daily.    . fluticasone (FLONASE) 50 MCG/ACT nasal spray PLACE 2 SPRAYS IN EACH NOSTRIL DAILY 32 g 5  . ibuprofen (ADVIL,MOTRIN) 100 MG tablet Take 200 mg by mouth daily.     Marland Kitchen omeprazole (PRILOSEC) 20 MG capsule Take 1 capsule (20 mg total) by mouth daily. 100 capsule 3   No current facility-administered medications for this visit.     Objective: BP 138/74   Pulse 65   Temp 98.2 F (36.8 C) (Oral)   Ht 5' 3.25" (1.607 m)   Wt 194 lb (88 kg)   SpO2 98%   BMI 34.09 kg/m  Gen: NAD, resting comfortably TM normal, nares erythematous with clear drainage, throat with some cobblestoning CV: RRR no murmurs rubs or gallops Lungs: CTAB no crackles, wheeze, rhonchi Abdomen: soft/nontender/nondistended/normal bowel sounds.  obese  Ext: no edema Skin: warm, dry Neuro: normal gait   Assessment/Plan:   Essential hypertension S: controlled on  atenolol-chlorthalidone 25-50mg  half tablet per JNC 8 BP Readings from Last 3 Encounters:  02/20/16 138/74  01/03/16 (!) 159/82  12/28/15 140/79  A/P:Continue current meds:  Also discussed importance of weight loss. Not motivated at present- encouraged follow up [facility] in 3 months to reasses with Wynetta Fines  GERD (gastroesophageal reflux disease) S: compliant with prilosec 20mg - may transition to zantac and was told ok by GI. EGD done 06/02/15 to rule out hiatal hernia or esophagitis was negative. A/P: chronic cough but doubt this is the cause  Osteopenia S: compliant with Ca/vit d. Lumbar spine -1.2 in 2016, rest normal A/P: continue current medicine- repeat 3-5 years from 2016  Vaginitis, atrophic S: astrophic vaginitis- has been given vaginal cream with estrogen as of April 2017 premarin- very rare use. Fearful of the estrogen.  A/P: continue current rare use- uses for irritation only and reasonable  Osteoarthritis of left knee S: R knee OA per Dr. Durward Fortes orthopedics. Had injecion 12/28/2015. Doing better. Worse with cold.  A/P: continue ortho follow up- advised regular exercise for weight loss. May consider biking but not motivated at present   Hematuria Noted 2008. Reports has seen urology- get records. Also get UA updated  Obesity HS 145. Even with 20 lb cushion- would target closer to 165.  States she I snot ready at present. Has lost 50 lbs in past but had gained weight. Touch base at [facility] to further encourage  Return in about 6 months (around 08/19/2016) for follow up but see susan in 3 months.  Orders Placed This Encounter  Procedures  . DG Chest 2 View    Standing Status:   Future    Standing Expiration Date:   04/19/2017    Order Specific Question:   Reason for Exam (SYMPTOM  OR DIAGNOSIS REQUIRED)    Answer:   cough for 8 weeks    Order  Specific Question:   Preferred imaging location?    Answer:   [facility]  . CBC    Standing Status:   Future    Standing Expiration Date:   02/19/2017  . Comprehensive metabolic panel    [facility] [facility]    Standing Status:   Future    Standing Expiration Date:   02/19/2017  . Hepatitis C antibody, reflex    [facility]    Standing Status:   Future    Standing Expiration Date:   02/19/2017  . Lipid panel    Standing Status:   Future    Standing Expiration Date:   02/19/2017  . POCT Urinalysis Dipstick (Automated)    Standing Status:   Future    Standing Expiration Date:   05/20/2016    No orders of the defined types were placed in this encounter.   Return precautions advised.  [name], MD     Chronic cough- 2 months S: dry cough since thanksgiving and always feels like she has a glob in her throat- did have EGD last year- states BP up when has this issue. Had some sinus pressure and nasal congestion but that has resolved at this point A/P: will get CXR to rule out walking pna thought doubt. Patient did well with prednisone when had similar episode years ago and will call me if she becomes ready for this   Pneumovax had fever and chills so wants to hold on prevnar 13 until she feels better

## 2016-02-20 NOTE — Assessment & Plan Note (Signed)
S: compliant with Ca/vit d. Lumbar spine -1.2 in 2016, rest normal A/P: continue current medicine- repeat 3-5 years from 2016

## 2016-02-20 NOTE — Assessment & Plan Note (Signed)
Noted 2008. Reports has seen urology- get records. Also get UA updated

## 2016-02-20 NOTE — Assessment & Plan Note (Signed)
S: astrophic vaginitis- has been given vaginal cream with estrogen as of April 2017 premarin- very rare use. Fearful of the estrogen.  A/P: continue current rare use- uses for irritation only and reasonable

## 2016-02-20 NOTE — Assessment & Plan Note (Signed)
S: R knee OA per Dr. Durward Fortes orthopedics. Had injecion 12/28/2015. Doing better. Worse with cold.  A/P: continue ortho follow up- advised regular exercise for weight loss. May consider biking but not motivated at present

## 2016-02-20 NOTE — Assessment & Plan Note (Signed)
S: compliant with prilosec 20mg - may transition to zantac and was told ok by GI. EGD done 06/02/15 to rule out hiatal hernia or esophagitis was negative. A/P: chronic cough but doubt this is the cause

## 2016-02-20 NOTE — Progress Notes (Signed)
Pre visit review using our clinic review tool, if applicable. No additional management support is needed unless otherwise documented below in the visit note. 

## 2016-02-20 NOTE — Assessment & Plan Note (Signed)
S: controlled on  atenolol-chlorthalidone 25-50mg  half tablet per JNC 8 BP Readings from Last 3 Encounters:  02/20/16 138/74  01/03/16 (!) 159/82  12/28/15 140/79  A/P:Continue current meds:  Also discussed importance of weight loss. Not motivated at present- encouraged follow up [facility] in 3 months to reasses with Marie Ramirez

## 2016-02-20 NOTE — Assessment & Plan Note (Signed)
HS 145. Even with 20 lb cushion- would target closer to 165. States she I snot ready at present. Has lost 50 lbs in past but had gained weight. Touch base at [facility] to further encourage

## 2016-04-02 ENCOUNTER — Other Ambulatory Visit: Payer: Self-pay | Admitting: Family Medicine

## 2016-04-02 ENCOUNTER — Ambulatory Visit (INDEPENDENT_AMBULATORY_CARE_PROVIDER_SITE_OTHER): Payer: Medicare Other

## 2016-04-02 VITALS — BP 126/80 | HR 65 | Ht 63.0 in | Wt 193.2 lb

## 2016-04-02 DIAGNOSIS — Z7289 Other problems related to lifestyle: Secondary | ICD-10-CM | POA: Diagnosis not present

## 2016-04-02 DIAGNOSIS — R319 Hematuria, unspecified: Secondary | ICD-10-CM | POA: Diagnosis not present

## 2016-04-02 DIAGNOSIS — I10 Essential (primary) hypertension: Secondary | ICD-10-CM | POA: Diagnosis not present

## 2016-04-02 DIAGNOSIS — Z0000 Encounter for general adult medical examination without abnormal findings: Secondary | ICD-10-CM

## 2016-04-02 LAB — CBC
HCT: 42.6 % (ref 36.0–46.0)
HEMOGLOBIN: 14.6 g/dL (ref 12.0–15.0)
MCHC: 34.2 g/dL (ref 30.0–36.0)
MCV: 88.4 fl (ref 78.0–100.0)
Platelets: 395 10*3/uL (ref 150.0–400.0)
RBC: 4.82 Mil/uL (ref 3.87–5.11)
RDW: 12.8 % (ref 11.5–15.5)
WBC: 8.4 10*3/uL (ref 4.0–10.5)

## 2016-04-02 LAB — POC URINALSYSI DIPSTICK (AUTOMATED)
Bilirubin, UA: NEGATIVE
Glucose, UA: NEGATIVE
Ketones, UA: NEGATIVE
NITRITE UA: NEGATIVE
PROTEIN UA: NEGATIVE
Spec Grav, UA: 1.025
UROBILINOGEN UA: 0.2
pH, UA: 5.5

## 2016-04-02 LAB — COMPREHENSIVE METABOLIC PANEL
ALBUMIN: 4 g/dL (ref 3.5–5.2)
ALK PHOS: 63 U/L (ref 39–117)
ALT: 12 U/L (ref 0–35)
AST: 15 U/L (ref 0–37)
BUN: 20 mg/dL (ref 6–23)
CO2: 31 mEq/L (ref 19–32)
CREATININE: 1.16 mg/dL (ref 0.40–1.20)
Calcium: 10.2 mg/dL (ref 8.4–10.5)
Chloride: 105 mEq/L (ref 96–112)
GFR: 49.2 mL/min — ABNORMAL LOW (ref >60.00)
Glucose, Bld: 108 mg/dL — ABNORMAL HIGH (ref 70–99)
POTASSIUM: 4 meq/L (ref 3.5–5.1)
SODIUM: 145 meq/L (ref 135–145)
TOTAL PROTEIN: 7.3 g/dL (ref 6.0–8.3)
Total Bilirubin: 0.5 mg/dL (ref 0.2–1.2)

## 2016-04-02 LAB — LIPID PANEL
CHOLESTEROL: 229 mg/dL — AB (ref 0–200)
HDL: 43.7 mg/dL (ref >39.00)
LDL Cholesterol: 157 mg/dL — ABNORMAL HIGH (ref 0–99)
NonHDL: 185
Total CHOL/HDL Ratio: 5
Triglycerides: 142 mg/dL (ref 0.0–149.0)
VLDL: 28.4 mg/dL (ref 0.0–40.0)

## 2016-04-02 LAB — HEPATITIS C ANTIBODY: HCV AB: NEGATIVE

## 2016-04-02 NOTE — Patient Instructions (Addendum)
Marie Ramirez , Thank you for taking time to come for your Medicare Wellness Visit. I appreciate your ongoing commitment to your health goals. Please review the following plan we discussed and let me know if I can assist you in the future.   Can find out more about the pneumonia vaccines;  The Centers for Disease Control are now recommending 2 pneumonia vaccinations after 26. The first is the Prevnar 13. This helps to boost your immunity to community acquired pneumonia as well as some protection from bacterial pneumonia  The 2nd is the pneumovax 23, which offers more broad protection!  Please consider taking these as this is your best protection against pneumonia.   Prevention of falls: Remove rugs or any tripping hazards in the home Use Non slip mats in bathtubs and showers Placing grab bars next to the toilet and or shower Placing handrails on both sides of the stair way Adding extra lighting in the home.   Personal safety issues reviewed:  1. Consider starting a community watch program per [facility] 2.  Changes batteries is smoke detector and/or carbon monoxide detector  3.  If you have firearms; keep them in a safe place 4.  Wear protection when in the sun; Always wear sunscreen or a hat; It is good to have your doctor check your skin annually or review any new areas of concern 5. Driving safety; Keep in the right lane; stay 3 car lengths behind the car in front of you on the highway; look 3 times prior to pulling out; carry your cell phone everywhere you go!    Learn about the Yellow Dot program:  The program allows first responders at your emergency to have access to who your physician is, as well as your medications and medical conditions.  Citizens requesting the Yellow Dot Packages should contact Master Corporal Nunzio Cobbs at the [facility] [phone] for the first week of the program and beginning the week after Easter citizens should  contact their Scientist, physiological.       These are the goals we discussed: Goals    . Weight (lb) < 180 lb (81.6 kg)          Check out  online nutrition programs as GumSearch.nl and http://vang.com/; fit45m;  Look for foods with "whole" wheat; bran; oatmeal etc Shot at the farmer's markets in season for fresher choices  Watch for "hydrogenated" on the label of oils which are trans-fats.  Watch for "high fructose corn syrup" in snacks, yogurt or ketchup  Meats have less marbling; bright colored fruits and vegetables;  Canned; dump out liquid and wash vegetables. Be mindful of what we are eating  Portion control is essential to a health weight! Sit down; take a break and enjoy your meal; take smaller bites; put the fork down between bites;  It takes 20 minutes to get full; so check in with your fullness cues and stop eating when you start to fill full              This is a list of the screening recommended for you and due dates:  Health Maintenance  Topic Date Due  .  Hepatitis C: One time screening is recommended by Center for Disease Control  (CDC) for  adults born from [birth year]through 1964.   [id number] . Pneumonia vaccines (1 of 2 - PCV13) 01/12/2012  . Mammogram  09/28/2017  . Tetanus Vaccine  02/18/2023  . Colon Cancer Screening  03/05/2023  . Flu Shot  Completed  . DEXA scan (bone density measurement)  Completed    Summary: Preventive Care for Adults  A healthy lifestyle and preventive care can promote health and wellness. Preventive health guidelines for adults include the following key practices.  . A routine yearly physical is a good way to check with your health care provider about your health and preventive screening. It is a chance to share any concerns and updates on your health and to receive a thorough exam.  . Visit your dentist for a routine exam and preventive care every 6 months. Brush your teeth twice a day and floss once a day.  Good oral hygiene prevents tooth decay and gum disease.  . The frequency of eye exams is based on your age, health, family medical history, use  of contact lenses, and other factors. Follow your health care provider's ecommendations for frequency of eye exams.  . Eat a healthy diet. Foods like vegetables, fruits, whole grains, low-fat dairy products, and lean protein foods contain the nutrients you need without too many calories. Decrease your intake of foods high in solid fats, added sugars, and salt. Eat the right amount of calories for you. Get information about a proper diet from your health care provider, if necessary.  . Regular physical exercise is one of the most important things you can do for your health. Most adults should get at least 150 minutes of moderate-intensity exercise (any activity that increases your heart rate and causes you to sweat) each week. In addition, most adults need muscle-strengthening exercises on 2 or more days a week.  Silver Sneakers may be a benefit available to you. To determine eligibility, you may visit the website: www.silversneakers.com or contact program at 954-430-8549 Mon-Fri between 8AM-8PM.   . Maintain a healthy weight. The body mass index (BMI) is a screening tool to identify possible weight problems. It provides an estimate of body fat based on height and weight. Your health care provider can find your BMI and can help you achieve or maintain a healthy weight.   For adults 20 years and older: ? A BMI below 18.5 is considered underweight. ? A BMI of 18.5 to 24.9 is normal. ? A BMI of 27 to 28 is considered normal by the Institutes of Health  ? A BMI of 30 and above is considered obese.   . Maintain normal blood lipids and cholesterol levels by exercising and minimizing your intake of saturated fat. Eat a balanced diet with plenty of fruit and vegetables. Blood tests for lipids and cholesterol should begin at age 24 and be repeated every  5 years. If your lipid or cholesterol levels are high, you are over 50, or you are at high risk for heart disease, you may need your cholesterol levels checked more frequently. Ongoing high lipid and cholesterol levels should be treated with medicines if diet and exercise are not working. .  . If you are 25-71 years old, ask your health care provider if you should take aspirin to prevent strokes.  . Use sunscreen. Apply sunscreen liberally and repeatedly throughout the day. You should seek shade when your shadow is shorter than you. Protect yourself by wearing long sleeves, pants, a wide-brimmed hat, and sunglasses year round, whenever you are outdoors.  . Once a month, do a whole body skin exam, using a mirror to look at the skin on your back. Tell your health care provider of new moles, moles that have irregular borders,  moles that are larger than a pencil eraser, or moles that have changed in shape or color.  Last, if you have completed an Advanced Directive; please bring a copy and review with your physician and then we will scan to the medical record    [place] stands for "Dietary Approaches to Stop Hypertension." The DASH eating plan is a healthy eating plan that has been shown to reduce high blood pressure (hypertension). It may also reduce your risk for type 2 diabetes, heart disease, and stroke. The DASH eating plan may also help with weight loss. What are tips for following this plan? General guidelines   Avoid eating more than 2,300 mg (milligrams) of salt (sodium) a day. If you have hypertension, you may need to reduce your sodium intake to 1,500 mg a day.  Limit alcohol intake to no more than 1 drink a day for nonpregnant women and 2 drinks a day for men. One drink equals 12 oz of beer, 5 oz of wine, or 1 oz of hard liquor.  Work with your health care provider to maintain a healthy body weight or to lose weight. Ask what an ideal weight is for you.  Get at  least 30 minutes of exercise that causes your heart to beat faster (aerobic exercise) most days of the week. Activities may include walking, swimming, or biking.  Work with your health care provider or diet and nutrition specialist (dietitian) to adjust your eating plan to your individual calorie needs. Reading food labels   Check food labels for the amount of sodium per serving. Choose foods with less than 5 percent of the Daily Value of sodium. Generally, foods with less than 300 mg of sodium per serving fit into this eating plan.  To find whole grains, look for the word "whole" as the first word in the ingredient list. Shopping   Buy products labeled as "low-sodium" or "no salt added."  Buy fresh foods. Avoid canned foods and premade or frozen meals. Cooking   Avoid adding salt when cooking. Use salt-free seasonings or herbs instead of table salt or sea salt. Check with your health care provider or pharmacist before using salt substitutes.  Do not fry foods. Cook foods using healthy methods such as baking, boiling, grilling, and broiling instead.  Cook with heart-healthy oils, such as olive, canola, soybean, or sunflower oil. Meal planning    Eat a balanced diet that includes:  5 or more servings of fruits and vegetables each day. At each meal, try to fill half of your plate with fruits and vegetables.  Up to 6-8 servings of whole grains each day.  Less than 6 oz of lean meat, poultry, or fish each day. A 3-oz serving of meat is about the same size as a deck of cards. One egg equals 1 oz.  2 servings of low-fat dairy each day.  A serving of nuts, seeds, or beans 5 times each week.  Heart-healthy fats. Healthy fats called Omega-3 fatty acids are found in foods such as flaxseeds and coldwater fish, like sardines, salmon, and mackerel.  Limit how much you eat of the following:  Canned or prepackaged foods.  Food that is high in trans fat, such as fried foods.  Food that is  high in saturated fat, such as fatty meat.  Sweets, desserts, sugary drinks, and other foods with added sugar.  Full-fat dairy products.  Do not salt foods before eating.  Try to eat at least 2 vegetarian meals  each week.  Eat more home-cooked food and less restaurant, buffet, and fast food.  When eating at a restaurant, ask that your food be prepared with less salt or no salt, if possible. What foods are recommended? The items listed may not be a complete list. Talk with your dietitian about what dietary choices are best for you. Grains  Whole-grain or whole-wheat bread. Whole-grain or whole-wheat pasta. Brown rice. [place]. Bulgur. Whole-grain and low-sodium cereals. Pita bread. Low-fat, low-sodium crackers. Whole-wheat flour tortillas. Vegetables  Fresh or frozen vegetables (raw, steamed, roasted, or grilled). Low-sodium or reduced-sodium tomato and vegetable juice. Low-sodium or reduced-sodium tomato sauce and tomato paste. Low-sodium or reduced-sodium canned vegetables. Fruits  All fresh, dried, or frozen fruit. Canned fruit in natural juice (without added sugar). Meat and other protein foods  Skinless chicken or [place]. Ground chicken or [place]. Pork with fat trimmed off. Fish and seafood. Egg whites. Dried beans, peas, or lentils. Unsalted nuts, nut butters, and seeds. Unsalted canned beans. Lean cuts of beef with fat trimmed off. Low-sodium, lean deli meat. Dairy  Low-fat (1%) or fat-free (skim) milk. Fat-free, low-fat, or reduced-fat cheeses. Nonfat, low-sodium ricotta or cottage cheese. Low-fat or nonfat yogurt. Low-fat, low-sodium cheese. Fats and oils  Soft margarine without trans fats. Vegetable oil. Low-fat, reduced-fat, or light mayonnaise and salad dressings (reduced-sodium). Canola, safflower, olive, soybean, and sunflower oils. Avocado. Seasoning and other foods  Herbs. Spices. Seasoning mixes without salt. Unsalted popcorn and pretzels. Fat-free sweets. What  foods are not recommended? The items listed may not be a complete list. Talk with your dietitian about what dietary choices are best for you. Grains  Baked goods made with fat, such as croissants, muffins, or some breads. Dry pasta or rice meal packs. Vegetables  Creamed or fried vegetables. Vegetables in a cheese sauce. Regular canned vegetables (not low-sodium or reduced-sodium). Regular canned tomato sauce and paste (not low-sodium or reduced-sodium). Regular tomato and vegetable juice (not low-sodium or reduced-sodium). Angie Fava. Olives. Fruits  Canned fruit in a light or heavy syrup. Fried fruit. Fruit in cream or butter sauce. Meat and other protein foods  Fatty cuts of meat. Ribs. Fried meat. Berniece Salines. Sausage. Bologna and other processed lunch meats. Salami. Fatback. Hotdogs. Bratwurst. Salted nuts and seeds. Canned beans with added salt. Canned or smoked fish. Whole eggs or egg yolks. Chicken or [place] with skin. Dairy  Whole or 2% milk, cream, and half-and-half. Whole or full-fat cream cheese. Whole-fat or sweetened yogurt. Full-fat cheese. Nondairy creamers. Whipped toppings. Processed cheese and cheese spreads. Fats and oils  Butter. Stick margarine. Lard. Shortening. Ghee. Bacon fat. Tropical oils, such as coconut, palm kernel, or palm oil. Seasoning and other foods  Salted popcorn and pretzels. Onion salt, garlic salt, seasoned salt, table salt, and sea salt. Worcestershire sauce. Tartar sauce. Barbecue sauce. Teriyaki sauce. Soy sauce, including reduced-sodium. Steak sauce. Canned and packaged gravies. Fish sauce. Oyster sauce. Cocktail sauce. Horseradish that you find on the shelf. Ketchup. Mustard. Meat flavorings and tenderizers. Bouillon cubes. Hot sauce and Tabasco sauce. Premade or packaged marinades. Premade or packaged taco seasonings. Relishes. Regular salad dressings. Where to find more information:  National Heart, Lung, and [place]: https://wilson-eaton.com/  American Heart  Association: www.heart.org Summary  The DASH eating plan is a healthy eating plan that has been shown to reduce high blood pressure (hypertension). It may also reduce your risk for type 2 diabetes, heart disease, and stroke.  With the DASH eating plan, you should limit salt (sodium) intake  to 2,300 mg a day. If you have hypertension, you may need to reduce your sodium intake to 1,500 mg a day.  When on the DASH eating plan, aim to eat more fresh fruits and vegetables, whole grains, lean proteins, low-fat dairy, and heart-healthy fats.  Work with your health care provider or diet and nutrition specialist (dietitian) to adjust your eating plan to your individual calorie needs. This information is not intended to replace advice given to you by your health care provider. Make sure you discuss any questions you have with your health care provider. Document Released: 12/28/2010 Document Revised: 01/02/2016 Document Reviewed: 01/02/2016 Elsevier Interactive Patient Education  2017 [place] Prevention in the Home Falls can cause injuries. They can happen to people of all ages. There are many things you can do to make your home safe and to help prevent falls. What can I do on the outside of my home?  Regularly fix the edges of walkways and driveways and fix any cracks.  Remove anything that might make you trip as you walk through a door, such as a raised step or threshold.  Trim any bushes or trees on the path to your home.  Use bright outdoor lighting.  Clear any walking paths of anything that might make someone trip, such as rocks or tools.  Regularly check to see if handrails are loose or broken. Make sure that both sides of any steps have handrails.  Any raised decks and porches should have guardrails on the edges.  Have any leaves, snow, or ice cleared regularly.  Use sand or salt on walking paths during winter.  Clean up any spills in your garage right away. This  includes oil or grease spills. What can I do in the bathroom?  Use night lights.  Install grab bars by the toilet and in the tub and shower. Do not use towel bars as grab bars.  Use non-skid mats or decals in the tub or shower.  If you need to sit down in the shower, use a plastic, non-slip stool.  Keep the floor dry. Clean up any water that spills on the floor as soon as it happens.  Remove soap buildup in the tub or shower regularly.  Attach bath mats securely with double-sided non-slip rug tape.  Do not have throw rugs and other things on the floor that can make you trip. What can I do in the bedroom?  Use night lights.  Make sure that you have a light by your bed that is easy to reach.  Do not use any sheets or blankets that are too big for your bed. They should not hang down onto the floor.  Have a firm chair that has side arms. You can use this for support while you get dressed.  Do not have throw rugs and other things on the floor that can make you trip. What can I do in the kitchen?  Clean up any spills right away.  Avoid walking on wet floors.  Keep items that you use a lot in easy-to-reach places.  If you need to reach something above you, use a strong step stool that has a grab bar.  Keep electrical cords out of the way.  Do not use floor polish or wax that makes floors slippery. If you must use wax, use non-skid floor wax.  Do not have throw rugs and other things on the floor that can make you trip. What can I  do with my stairs?  Do not leave any items on the stairs.  Make sure that there are handrails on both sides of the stairs and use them. Fix handrails that are broken or loose. Make sure that handrails are as long as the stairways.  Check any carpeting to make sure that it is firmly attached to the stairs. Fix any carpet that is loose or worn.  Avoid having throw rugs at the top or bottom of the stairs. If you do have throw rugs, attach them to the  floor with carpet tape.  Make sure that you have a light switch at the top of the stairs and the bottom of the stairs. If you do not have them, ask someone to add them for you. What else can I do to help prevent falls?  Wear shoes that:  Do not have high heels.  Have rubber bottoms.  Are comfortable and fit you well.  Are closed at the toe. Do not wear sandals.  If you use a stepladder:  Make sure that it is fully opened. Do not climb a closed stepladder.  Make sure that both sides of the stepladder are locked into place.  Ask someone to hold it for you, if possible.  Clearly mark and make sure that you can see:  Any grab bars or handrails.  First and last steps.  Where the edge of each step is.  Use tools that help you move around (mobility aids) if they are needed. These include:  Canes.  Walkers.  Scooters.  Crutches.  Turn on the lights when you go into a dark area. Replace any light bulbs as soon as they burn out.  Set up your furniture so you have a clear path. Avoid moving your furniture around.  If any of your floors are uneven, fix them.  If there are any pets around you, be aware of where they are.  Review your medicines with your doctor. Some medicines can make you feel dizzy. This can increase your chance of falling. Ask your doctor what other things that you can do to help prevent falls. This information is not intended to replace advice given to you by your health care provider. Make sure you discuss any questions you have with your health care provider. Document Released: 11/04/2008 Document Revised: 06/16/2015 Document Reviewed: 02/12/2014 Elsevier Interactive Patient Education  2017 [place] Maintenance, Female Adopting a healthy lifestyle and getting preventive care can go a long way to promote health and wellness. Talk with your health care provider about what schedule of regular examinations is right for you. This is a good  chance for you to check in with your provider about disease prevention and staying healthy. In between checkups, there are plenty of things you can do on your own. Experts have done a lot of research about which lifestyle changes and preventive measures are most likely to keep you healthy. Ask your health care provider for more information. Weight and diet Eat a healthy diet  Be sure to include plenty of vegetables, fruits, low-fat dairy products, and lean protein.  Do not eat a lot of foods high in solid fats, added sugars, or salt.  Get regular exercise. This is one of the most important things you can do for your health.  Most adults should exercise for at least 150 minutes each week. The exercise should increase your heart rate and make you sweat (moderate-intensity exercise).  Most adults should also do strengthening exercises  at least twice a week. This is in addition to the moderate-intensity exercise. Maintain a healthy weight  Body mass index (BMI) is a measurement that can be used to identify possible weight problems. It estimates body fat based on height and weight. Your health care provider can help determine your BMI and help you achieve or maintain a healthy weight.  For females 16 years of age and older:  A BMI below 18.5 is considered underweight.  A BMI of 18.5 to 24.9 is normal.  A BMI of 25 to 29.9 is considered overweight.  A BMI of 30 and above is considered obese. Watch levels of cholesterol and blood lipids  You should start having your blood tested for lipids and cholesterol at 70 years of age, then have this test every 5 years.  You may need to have your cholesterol levels checked more often if:  Your lipid or cholesterol levels are high.  You are older than 70 years of age.  You are at high risk for heart disease. Cancer screening Lung Cancer  Lung cancer screening is recommended for adults 32-20 years old who are at high risk for lung cancer because  of a history of smoking.  A yearly low-dose CT scan of the lungs is recommended for people who:  Currently smoke.  Have quit within the past 15 years.  Have at least a 30-pack-year history of smoking. A pack year is smoking an average of one pack of cigarettes a day for 1 year.  Yearly screening should continue until it has been 15 years since you quit.  Yearly screening should stop if you develop a health problem that would prevent you from having lung cancer treatment. Breast Cancer  Practice breast self-awareness. This means understanding how your breasts normally appear and feel.  It also means doing regular breast self-exams. Let your health care provider know about any changes, no matter how small.  If you are in your 20s or 30s, you should have a clinical breast exam (CBE) by a health care provider every 1-3 years as part of a regular health exam.  If you are 74 or older, have a CBE every year. Also consider having a breast X-ray (mammogram) every year.  If you have a family history of breast cancer, talk to your health care provider about genetic screening.  If you are at high risk for breast cancer, talk to your health care provider about having an MRI and a mammogram every year.  Breast cancer gene (BRCA) assessment is recommended for women who have family members with BRCA-related cancers. BRCA-related cancers include:  Breast.  Ovarian.  Tubal.  Peritoneal cancers.  Results of the assessment will determine the need for genetic counseling and BRCA1 and BRCA2 testing. Cervical Cancer  Your health care provider may recommend that you be screened regularly for cancer of the pelvic organs (ovaries, uterus, and vagina). This screening involves a pelvic examination, including checking for microscopic changes to the surface of your cervix (Pap test). You may be encouraged to have this screening done every 3 years, beginning at age 31.  For women ages 78-65, health care  providers may recommend pelvic exams and Pap testing every 3 years, or they may recommend the Pap and pelvic exam, combined with testing for human papilloma virus (HPV), every 5 years. Some types of HPV increase your risk of cervical cancer. Testing for HPV may also be done on women of any age with unclear Pap test results.  Other health  care providers may not recommend any screening for nonpregnant women who are considered low risk for pelvic cancer and who do not have symptoms. Ask your health care provider if a screening pelvic exam is right for you.  If you have had past treatment for cervical cancer or a condition that could lead to cancer, you need Pap tests and screening for cancer for at least 20 years after your treatment. If Pap tests have been discontinued, your risk factors (such as having a new sexual partner) need to be reassessed to determine if screening should resume. Some women have medical problems that increase the chance of getting cervical cancer. In these cases, your health care provider may recommend more frequent screening and Pap tests. Colorectal Cancer  This type of cancer can be detected and often prevented.  Routine colorectal cancer screening usually begins at 70 years of age and continues through 70 years of age.  Your health care provider may recommend screening at an earlier age if you have risk factors for colon cancer.  Your health care provider may also recommend using home test kits to check for hidden blood in the stool.  A small camera at the end of a tube can be used to examine your colon directly (sigmoidoscopy or colonoscopy). This is done to check for the earliest forms of colorectal cancer.  Routine screening usually begins at age 107.  Direct examination of the colon should be repeated every 5-10 years through 70 years of age. However, you may need to be screened more often if early forms of precancerous polyps or small growths are found. Skin  Cancer  Check your skin from head to toe regularly.  Tell your health care provider about any new moles or changes in moles, especially if there is a change in a mole's shape or color.  Also tell your health care provider if you have a mole that is larger than the size of a pencil eraser.  Always use sunscreen. Apply sunscreen liberally and repeatedly throughout the day.  Protect yourself by wearing long sleeves, pants, a wide-brimmed hat, and sunglasses whenever you are outside. Heart disease, diabetes, and high blood pressure  High blood pressure causes heart disease and increases the risk of stroke. High blood pressure is more likely to develop in:  People who have blood pressure in the high end of the normal range (130-139/85-89 mm Hg).  People who are overweight or obese.  People who are African American.  If you are 3-86 years of age, have your blood pressure checked every 3-5 years. If you are 39 years of age or older, have your blood pressure checked every year. You should have your blood pressure measured twice-once when you are at a hospital or clinic, and once when you are not at a hospital or clinic. Record the average of the two measurements. To check your blood pressure when you are not at a hospital or clinic, you can use:  An automated blood pressure machine at a pharmacy.  A home blood pressure monitor.  If you are between 80 years and 32 years old, ask your health care provider if you should take aspirin to prevent strokes.  Have regular diabetes screenings. This involves taking a blood sample to check your fasting blood sugar level.  If you are at a normal weight and have a low risk for diabetes, have this test once every three years after 70 years of age.  If you are overweight and have a high  risk for diabetes, consider being tested at a younger age or more often. Preventing infection Hepatitis B  If you have a higher risk for hepatitis B, you should be  screened for this virus. You are considered at high risk for hepatitis B if:  You were born in a country where hepatitis B is common. Ask your health care provider which countries are considered high risk.  Your parents were born in a high-risk country, and you have not been immunized against hepatitis B (hepatitis B vaccine).  You have HIV or AIDS.  You use needles to inject street drugs.  You live with someone who has hepatitis B.  You have had sex with someone who has hepatitis B.  You get hemodialysis treatment.  You take certain medicines for conditions, including cancer, organ transplantation, and autoimmune conditions. Hepatitis C  Blood testing is recommended for:  Everyone born from [birth year] through 1964.  Anyone with known risk factors for hepatitis C. Sexually transmitted infections (STIs)  You should be screened for sexually transmitted infections (STIs) including gonorrhea and chlamydia if:  You are sexually active and are younger than 70 years of age.  You are older than 70 years of age and your health care provider tells you that you are at risk for this type of infection.  Your sexual activity has changed since you were last screened and you are at an increased risk for chlamydia or gonorrhea. Ask your health care provider if you are at risk.  If you do not have HIV, but are at risk, it may be recommended that you take a prescription medicine daily to prevent HIV infection. This is called pre-exposure prophylaxis (PrEP). You are considered at risk if:  You are sexually active and do not regularly use condoms or know the HIV status of your partner(s).  You take drugs by injection.  You are sexually active with a partner who has HIV. Talk with your health care provider about whether you are at high risk of being infected with HIV. If you choose to begin PrEP, you should first be tested for HIV. You should then be tested every 3 months for as long as you are taking  PrEP. Pregnancy  If you are premenopausal and you may become pregnant, ask your health care provider about preconception counseling.  If you may become pregnant, take 400 to 800 micrograms (mcg) of folic acid every day.  If you want to prevent pregnancy, talk to your health care provider about birth control (contraception). Osteoporosis and menopause  Osteoporosis is a disease in which the bones lose minerals and strength with aging. This can result in serious bone fractures. Your risk for osteoporosis can be identified using a bone density scan.  If you are 3 years of age or older, or if you are at risk for osteoporosis and fractures, ask your health care provider if you should be screened.  Ask your health care provider whether you should take a calcium or vitamin D supplement to lower your risk for osteoporosis.  Menopause may have certain physical symptoms and risks.  Hormone replacement therapy may reduce some of these symptoms and risks. Talk to your health care provider about whether hormone replacement therapy is right for you. Follow these instructions at home:  Schedule regular health, dental, and eye exams.  Stay current with your immunizations.  Do not use any tobacco products including cigarettes, chewing tobacco, or electronic cigarettes.  If you are pregnant, do not drink alcohol.  If  you are breastfeeding, limit how much and how often you drink alcohol.  Limit alcohol intake to no more than 1 drink per day for nonpregnant women. One drink equals 12 ounces of beer, 5 ounces of wine, or 1 ounces of hard liquor.  Do not use street drugs.  Do not share needles.  Ask your health care provider for help if you need support or information about quitting drugs.  Tell your health care provider if you often feel depressed.  Tell your health care provider if you have ever been abused or do not feel safe at home. This information is not intended to replace advice given  to you by your health care provider. Make sure you discuss any questions you have with your health care provider. Document Released: 07/24/2010 Document Revised: 06/16/2015 Document Reviewed: 10/12/2014 Elsevier Interactive Patient Education  2017 [employer].

## 2016-04-02 NOTE — Progress Notes (Signed)
I have reviewed and agree with note, evaluation, plan.   Valentine Barney, MD  

## 2016-04-02 NOTE — Progress Notes (Signed)
Subjective:   Marie Ramirez is a 71 y.o. female who presents for Medicare Annual (Subsequent) preventive examination.  The Patient was informed that the wellness visit is to identify future health risk and educate and initiate measures that can reduce risk for increased disease through the lifespan.    NO ROS; Medicare Wellness Visit  Describes health as good, fair or great? Good Spouse has been very ill;  Immune issues; Diabetes and other issues Some issues with OA   Preventive Screening -Counseling & Management  Colonoscopy:  02/2023 Mammogram 09/2015  ( has [facility] )  Dexa 02/2014 at [facility]  _-1.2    Smoking history no   Smokeless tobacco no Second Hand Smoke status; No Smokers in the home no ETOH no  Medication adherence or issues? No issues  Fluticasone causes some nasal bleeding some    RISK FACTORS Diet  Have to fix grilled meats due to spouses dm She gardens in the summer. Tries to eat a lot of vegetables.   Regular exercise  Pain in knee and hammertoe; started dragging the right foot but now better due to foot issues; Dr. 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  Long term goal is to "age in place" or undecided   Mobility of Functional changes this year? Safety; community, wears sunscreen, safe place for firearms; Motor vehicle accidents;   Mental Health:  Any emotional problems? Anxious,  depressed, irritable, sad or blue? no Denies feeling depressed or hopeless; voices pleasure in daily life How many social activities have you been engaged in within the last 2 weeks? no Who would help you with chores; illness; shopping other?   Hearing Screening   125Hz  250Hz  500Hz  1000Hz  2000Hz  3000Hz  4000Hz  6000Hz  8000Hz   Right ear:       100    Left ear:       100    Vision Screening Comments: Had 2012; cataract surgery with lens implants [facility]  She is scheduled to go to the [facility] center as she has a film after cataract    Activities of Daily Living - See functional screen   Cognitive testing; no issues  Ad8 score; 0 or less than 2  MMSE deferred or completed if AD8 + 2 issues  Advanced Directives agreed to take form and to be given information Advanced Directive; Reviewed advanced directive and agreed to receipt of information and discussion.  Focused face to face x  20 minutes discussing HCPOA and Living will and reviewed all the questions in the Marie Ramirez forms. The patient voices understanding of HCPOA; LW reviewed and information provided on each question. Educated on how to revoke this HCPOA or LW at any time.   Also  discussed life prolonging measures (given a few examples) and where she could choose to initiate or not;  the ability to given the HCPOA power to change her living will or not if she cannot speak for herself; as well as finalizing the will by 2 unrelated witnesses and notary.  Will call for questions and given information on [name] pastoral department for further assistance.      Patient Care Team: Marie Olp, MD as PCP - General (Family Medicine)   Immunization History  Administered Date(s) Administered  . Influenza Split 10/22/2012  . Influenza Whole 01/22/2005, 10/23/2006, 10/22/2008, 10/22/2009  . Influenza-Unspecified 10/22/2013, 11/09/2015  . Pneumococcal Polysaccharide-23 12/02/2007  . Td  01/22/2001  . Tetanus 02/17/2013   Required Immunizations needed today  Screening test up to date or reviewed for plan of completion Health Maintenance Due  Topic Date Due  . Hepatitis C Screening  April 17, 1945  . PNA vac Low Risk Adult (1 of 2 - PCV13) 01/12/2012   Will take Hep c Last pneumonia vaccine made her sick States she ran a fever and was sick;   Declines pneumonia today     Objective:     Vitals: BP 126/80   Pulse 65   Ht 5\' 3"  (1.6 m)   Wt 193 lb 3 oz (87.6 kg)   SpO2 97%   BMI 34.22 kg/m   Body mass index is 34.22 kg/m.   Tobacco History  Smoking Status  . Never Smoker  Smokeless Tobacco  . Never Used     Counseling given: Yes   Past Medical History:  Diagnosis Date  . Allergy   . Arthritis   . Chronic left shoulder pain 05/31/2011   Frozen shoulder history- did exercises and eventually resolved  . Hypertension   . Lichen planus 36/64/4034   Qualifier: Diagnosis of  By: Sherren Mocha MD, Jory Ee    Past Surgical History:  Procedure Laterality Date  . CARPAL TUNNEL RELEASE     rt  . CATARACT EXTRACTION W/ INTRAOCULAR LENS  IMPLANT, BILATERAL    . PARATHYROIDECTOMY  2004   1 gland removed  . PLANTAR FASCIA SURGERY  2005   left  . TONSILLECTOMY    . VAGINAL HYSTERECTOMY     Family History  Problem Relation Age of Onset  . Alzheimer's disease Mother   . Leukemia Father   . CVA Father   . Hypertension Father   . Hypertension Sister   . Colon cancer Neg Hx    History  Sexual Activity  . Sexual activity: Not on file    Outpatient Encounter Prescriptions as of 04/02/2016  Medication Sig  . aspirin 81 MG tablet Take 81 mg by mouth daily.  Marland Kitchen atenolol-chlorthalidone (TENORETIC) 50-25 MG tablet TAKE 1/2 TABLET BY MOUTH EVERY MORNING FOR HIGH BLOOD PRESSURE  . calcium citrate-vitamin D (CITRACAL+D) 315-200 MG-UNIT per tablet Take 1 tablet by mouth daily.  . fexofenadine (ALLEGRA) 180 MG tablet Take 180 mg by mouth daily.  . fluticasone (FLONASE)  50 MCG/ACT nasal spray PLACE 2 SPRAYS IN EACH NOSTRIL DAILY  . ibuprofen (ADVIL,MOTRIN) 100 MG tablet Take 200 mg by mouth daily.   Marland Kitchen omeprazole (PRILOSEC) 20 MG capsule Take 1 capsule (20 mg total) by mouth daily.   No facility-administered encounter medications on file as of 04/02/2016.     Activities of Daily Living In your present state of health, do you have any difficulty performing the following activities: 04/02/2016  Hearing? N  Vision? N  Difficulty concentrating or making decisions? N  Walking or climbing stairs? N  Dressing or bathing? N  Doing  errands, shopping? N  Preparing Food and eating ? N  Using the Toilet? N  In the past six months, have you accidently leaked urine? (No Data)  Do you have problems with loss of bowel control? N  Managing your Medications? N  Managing your Finances? N  Housekeeping or managing your Housekeeping? N  Some recent data might be hidden    Patient Care Team: Marie Olp, MD as PCP - General (Family Medicine)    Assessment:     Exercise Activities and Dietary recommendations Current Exercise Habits: Home exercise routine, Type of exercise: walking  Goals    . Weight (lb) < 180 lb (81.6 kg)          Check out  online nutrition programs as GumSearch.nl and http://vang.com/; fit5me;  Look for foods with "whole" wheat; bran; oatmeal etc Shot at the farmer's markets in season for fresher choices  Watch for "hydrogenated" on the label of oils which are trans-fats.  Watch for "high fructose corn syrup" in snacks, yogurt or ketchup  Meats have less marbling; bright colored fruits and vegetables;  Canned; dump out liquid and wash vegetables. Be mindful of what we are eating  Portion control is essential to a health weight! Sit down; take a break and enjoy your meal; take smaller bites; put the fork down between bites;  It takes 20 minutes to get full; so check in with your fullness cues and stop eating when you start to fill  full             Fall Risk Fall Risk  04/02/2016 04/02/2016 09/21/2015 02/24/2014 02/17/2013  Falls in the past year? Yes No No No No  Number falls in past yr: 1 - - - -   Depression Screen PHQ 2/9 Scores 04/02/2016 05/09/2015 02/24/2014 02/17/2013  PHQ - 2 Score 0 0 0 0     Cognitive Function MMSE - Mini Mental State Exam 04/02/2016  Not completed: (No Data)    nomal mental functioning;  No issues presented in assessment or history    Immunization History  Administered Date(s) Administered  . Influenza Split 10/22/2012  . Influenza Whole 01/22/2005, 10/23/2006, 10/22/2008, 10/22/2009  . Influenza-Unspecified 10/22/2013, 11/09/2015  . Pneumococcal Polysaccharide-23 12/02/2007  . Td 01/22/2001  . Tetanus 02/17/2013   Screening Tests Health Maintenance  Topic Date Due  . Hepatitis C Screening  10-21-1945  . PNA vac Low Risk Adult (1 of 2 - PCV13) 01/12/2012  . MAMMOGRAM  09/28/2017  . TETANUS/TDAP  02/18/2023  . COLONOSCOPY  03/05/2023  . INFLUENZA VACCINE  Completed  . DEXA SCAN  Completed      Plan:   Discussed strength bearing exercise and calcium 1200 per fday in supplement of food  Declines or pneumonia due to being ill when she took her PSV 23  Waiting blood work, but did discuss weight loss and options to consider; Developed plan and to see goal (for now, she has lost her last "uncle" and stressed due to accounting season; will try to start when she feels less tired.  During the course of the visit the patient was educated and counseled about the following appropriate screening and preventive services:   Vaccines to include Pneumoccal, Influenza, Hepatitis B, Td, Zostavax, HCV  Electrocardiogram  Cardiovascular Disease  Colorectal cancer screening she agreed to call and fup with GI as this is due   Bone density screening/ discussed weight bearing and climbs stairs every day 71 yo 8 times  Diabetes screening  neg  Glaucoma screening fup  annally  Mammography/annual   Nutrition counseling given as well as information on monitoring sodium and calories   Patient Instructions (the written plan) was given to the patient.   Wynetta Fines, RN  04/02/2016

## 2016-04-04 ENCOUNTER — Encounter: Payer: Self-pay | Admitting: Family Medicine

## 2016-04-04 DIAGNOSIS — R319 Hematuria, unspecified: Secondary | ICD-10-CM | POA: Diagnosis not present

## 2016-04-04 DIAGNOSIS — E785 Hyperlipidemia, unspecified: Secondary | ICD-10-CM | POA: Insufficient documentation

## 2016-04-04 LAB — URINALYSIS, MICROSCOPIC ONLY
RBC / HPF: NONE SEEN (ref 0–?)
WBC UA: NONE SEEN (ref 0–?)

## 2016-04-04 NOTE — Addendum Note (Signed)
Addended by: Elmer Picker on: 04/04/2016 11:45 AM   Modules accepted: Orders

## 2016-04-06 LAB — URINE CULTURE: Organism ID, Bacteria: NO GROWTH

## 2016-04-09 DIAGNOSIS — Z961 Presence of intraocular lens: Secondary | ICD-10-CM | POA: Diagnosis not present

## 2016-04-09 DIAGNOSIS — H26491 Other secondary cataract, right eye: Secondary | ICD-10-CM | POA: Diagnosis not present

## 2016-04-17 ENCOUNTER — Encounter: Payer: Self-pay | Admitting: Family Medicine

## 2016-04-17 ENCOUNTER — Other Ambulatory Visit: Payer: Self-pay | Admitting: Family Medicine

## 2016-04-18 ENCOUNTER — Other Ambulatory Visit: Payer: Self-pay

## 2016-04-18 MED ORDER — FLUTICASONE PROPIONATE 50 MCG/ACT NA SUSP: NASAL | 5 refills | Status: AC

## 2016-04-18 NOTE — Telephone Encounter (Signed)
Pt need new Rx for fluticasone   Pharm:  [facility]  [phone]   Pt state that she needs this medication she is out.

## 2016-05-11 ENCOUNTER — Other Ambulatory Visit: Payer: Self-pay | Admitting: Family Medicine

## 2016-08-28 ENCOUNTER — Encounter: Payer: Self-pay | Admitting: Podiatry

## 2016-08-28 ENCOUNTER — Other Ambulatory Visit (INDEPENDENT_AMBULATORY_CARE_PROVIDER_SITE_OTHER): Payer: Medicare Other | Admitting: Orthotics

## 2016-08-28 ENCOUNTER — Ambulatory Visit (INDEPENDENT_AMBULATORY_CARE_PROVIDER_SITE_OTHER): Payer: Medicare Other | Admitting: Podiatry

## 2016-08-28 DIAGNOSIS — M778 Other enthesopathies, not elsewhere classified: Secondary | ICD-10-CM

## 2016-08-28 DIAGNOSIS — M779 Enthesopathy, unspecified: Principal | ICD-10-CM

## 2016-08-28 DIAGNOSIS — M7751 Other enthesopathy of right foot: Secondary | ICD-10-CM

## 2016-08-28 NOTE — Progress Notes (Signed)
She presents today chief complaint of capsulitis second metatarsophalangeal joint. She states that he feels like it's burning between my toes. She states the burning radiates out into the toes.  Objective: Vital signs stable alert and oriented 3. She states she was doing very well until July and started burning again. She states the joints really are sore likely were burning a Stand. Pulses are palpable neurologic sensorium is intact. She has pain on palpation to the second interdigital space of the right foot. I do believe that she still has some capsulitis as well.  Assessment: Capsulitis neuritis second interdigital space right foot.  Plan: At this point unable to send her to [facility] to have a pair of orthotics made to offload the second metatarsal right foot. I injected the area today with prolonged local anesthetic will follow up with her once she has received her orthotics.

## 2016-08-29 ENCOUNTER — Encounter (INDEPENDENT_AMBULATORY_CARE_PROVIDER_SITE_OTHER): Payer: Self-pay | Admitting: Orthopaedic Surgery

## 2016-08-29 ENCOUNTER — Ambulatory Visit (INDEPENDENT_AMBULATORY_CARE_PROVIDER_SITE_OTHER): Payer: Medicare Other | Admitting: Orthopaedic Surgery

## 2016-08-29 VITALS — BP 142/91 | HR 70 | Resp 14 | Ht 63.0 in | Wt 190.0 lb

## 2016-08-29 DIAGNOSIS — M25561 Pain in right knee: Principal | ICD-10-CM

## 2016-08-29 DIAGNOSIS — M1711 Unilateral primary osteoarthritis, right knee: Secondary | ICD-10-CM

## 2016-08-29 DIAGNOSIS — G8929 Other chronic pain: Secondary | ICD-10-CM

## 2016-08-29 MED ORDER — LIDOCAINE HCL 1 % IJ SOLN
5.0000 mL | INTRAMUSCULAR | Status: AC | PRN
  Administered 2016-08-29: 5 mL

## 2016-08-29 MED ORDER — BUPIVACAINE HCL 0.5 % IJ SOLN
3.0000 mL | INTRAMUSCULAR | Status: AC | PRN
  Administered 2016-08-29: 3 mL via INTRA_ARTICULAR

## 2016-08-29 MED ORDER — METHYLPREDNISOLONE ACETATE 40 MG/ML IJ SUSP
80.0000 mg | INTRAMUSCULAR | Status: AC | PRN
  Administered 2016-08-29: 80 mg

## 2016-08-29 NOTE — Progress Notes (Signed)
Office Visit Note   Patient: Marie Ramirez           Date of Birth: [birth year]           MRN: [id number] Visit Date: 08/29/2016              Requested by: [name], MD [address], [place] [id number] PCP: [name], MD   Assessment & Plan: Visit Diagnoses osteoarthritis right knee   Plan: Cortisone injection. Consider Visco supplementation if no improvement  Follow-Up Instructions: No Follow-up on file.   Orders:  No orders of the defined types were placed in this encounter.  No orders of the defined types were placed in this encounter.     Procedures: Large Joint Inj Date/Time: 08/29/2016 12:40 PM Performed by: Garald Balding Authorized by: Garald Balding   Consent Given by:  Patient Timeout: prior to procedure the correct patient, procedure, and site was verified   Indications:  Pain and joint swelling Location:  Knee Site:  R knee Prep: patient was prepped and draped in usual sterile fashion   Needle Size:  25 G Needle Length:  1.5 inches Approach:  Anteromedial Ultrasound Guidance: No   Fluoroscopic Guidance: No   Arthrogram: No   Medications:  5 mL lidocaine 1 %; 80 mg methylPREDNISolone acetate 40 MG/ML; 3 mL bupivacaine 0.5 % Aspiration Attempted: No   Patient tolerance:  Patient tolerated the procedure well with no immediate complications     Clinical Data: No additional findings.   Subjective: Chief Complaint  Patient presents with  . Right Knee - Pain    Marie Ramirez is a 71 y o that presents with R knee pain x months. She relates she has R foot pain seen by Dr. [name], 08/28/16 and was placed in a boot. capsulitis   Prior cortisone injection right knee months ago with excellent relief. Recently had insidious recurrence of pain with some help using over-the-counter medicines HPI  Review of Systems  Constitutional: Negative for chills, fatigue and fever.  Eyes: Negative for itching.  Respiratory: Negative for  chest tightness and shortness of breath.   Cardiovascular: Negative for chest pain, palpitations and leg swelling.  Gastrointestinal: Negative for blood in stool, constipation and diarrhea.  Musculoskeletal: Positive for joint swelling. Negative for back pain, neck pain and neck stiffness.  Neurological: Negative for dizziness and numbness.  Hematological: Does not bruise/bleed easily.  Psychiatric/Behavioral: The patient is not nervous/anxious.      Objective: Vital Signs: BP (!) 142/91   Pulse 70   Resp 14   Ht 5\' 3"  (1.6 m)   Wt 190 lb (86.2 kg)   BMI 33.66 kg/m   Physical Exam  Ortho Exam right knee with localized medial joint pain. No effusion. No instability. No popliteal pain multiple varicosities but without any distal edema or. Neurovascular exam intact. Full extension. Flexion didn't over 105. Straight leg raise negative. Painless range of motion of both hips.  Specialty Comments:  No specialty comments available.  Imaging: No results found.   PMFS History: Patient Active Problem List   Diagnosis Date Noted  . Hyperlipidemia 04/04/2016  . Osteoarthritis of left knee 02/20/2016  . GERD (gastroesophageal reflux disease) 05/09/2015  . Osteopenia 03/15/2014  . Vaginitis, atrophic 02/24/2014  . Obesity 05/31/2011  . Hematuria 12/17/2006  . Essential hypertension 09/06/2006  . Allergic rhinitis 09/06/2006   Past Medical History:  Diagnosis Date  . Allergy   . Arthritis   .  Chronic left shoulder pain 05/31/2011   Frozen shoulder history- did exercises and eventually resolved  . Hypertension   . Lichen planus 60/10/9321   Qualifier: Diagnosis of  By: Sherren Mocha MD, Jory Ee     Family History  Problem Relation Age of Onset  . Alzheimer's disease Mother   . Leukemia Father   . CVA Father   . Hypertension Father   . Hypertension Sister   . Colon cancer Neg Hx     Past Surgical History:  Procedure Laterality Date  . CARPAL TUNNEL RELEASE     rt  . CATARACT  EXTRACTION W/ INTRAOCULAR LENS  IMPLANT, BILATERAL    . PARATHYROIDECTOMY  2004   1 gland removed  . PLANTAR FASCIA SURGERY  2005   left  . TONSILLECTOMY    . VAGINAL HYSTERECTOMY     Social History   Occupational History  . Not on file.   Social History Main Topics  . Smoking status: Never Smoker  . Smokeless tobacco: Never Used  . Alcohol use No  . Drug use: No  . Sexual activity: Not on file

## 2016-09-18 ENCOUNTER — Ambulatory Visit: Payer: Self-pay | Admitting: Orthotics

## 2016-09-18 DIAGNOSIS — M7751 Other enthesopathy of right foot: Secondary | ICD-10-CM

## 2016-09-18 DIAGNOSIS — M778 Other enthesopathies, not elsewhere classified: Secondary | ICD-10-CM

## 2016-09-18 DIAGNOSIS — M779 Enthesopathy, unspecified: Principal | ICD-10-CM

## 2016-09-18 NOTE — Progress Notes (Signed)
Patient came in today to pick up her CMFO, said they "feel great, it takes pressure off the bone". Very pleased with outcome...  Patient is paying balance of $300.00 self pay.Marie Ramirez

## 2016-10-02 DIAGNOSIS — Z1231 Encounter for screening mammogram for malignant neoplasm of breast: Secondary | ICD-10-CM | POA: Diagnosis not present

## 2016-10-02 LAB — HM MAMMOGRAPHY

## 2016-10-03 ENCOUNTER — Encounter: Payer: Self-pay | Admitting: Family Medicine

## 2016-12-11 ENCOUNTER — Other Ambulatory Visit: Payer: Self-pay | Admitting: Family Medicine

## 2016-12-11 MED ORDER — ATENOLOL-CHLORTHALIDONE 50-25 MG PO TABS: ORAL_TABLET | ORAL | 0 refills | Status: DC

## 2016-12-11 NOTE — Telephone Encounter (Signed)
Dr Ronney Lion pt

## 2017-06-23 ENCOUNTER — Other Ambulatory Visit: Payer: Self-pay | Admitting: Family Medicine

## 2017-06-24 MED ORDER — ATENOLOL-CHLORTHALIDONE 50-25 MG PO TABS: ORAL_TABLET | ORAL | 1 refills | Status: DC

## 2017-10-09 DIAGNOSIS — Z1231 Encounter for screening mammogram for malignant neoplasm of breast: Secondary | ICD-10-CM | POA: Diagnosis not present

## 2017-10-09 LAB — HM MAMMOGRAPHY

## 2017-10-11 ENCOUNTER — Encounter: Payer: Self-pay | Admitting: Family Medicine

## 2017-10-16 ENCOUNTER — Encounter (INDEPENDENT_AMBULATORY_CARE_PROVIDER_SITE_OTHER): Payer: Self-pay | Admitting: Orthopaedic Surgery

## 2017-10-16 ENCOUNTER — Ambulatory Visit (INDEPENDENT_AMBULATORY_CARE_PROVIDER_SITE_OTHER): Payer: Medicare Other | Admitting: Orthopaedic Surgery

## 2017-10-16 VITALS — BP 152/86 | HR 69 | Ht 63.0 in | Wt 193.0 lb

## 2017-10-16 DIAGNOSIS — G5602 Carpal tunnel syndrome, left upper limb: Secondary | ICD-10-CM

## 2017-10-16 MED ORDER — LIDOCAINE HCL (PF) 1 % IJ SOLN
0.3000 mL | INTRAMUSCULAR | Status: AC | PRN
  Administered 2017-10-16: .3 mL

## 2017-10-16 MED ORDER — METHYLPREDNISOLONE ACETATE 40 MG/ML IJ SUSP
13.3300 mg | INTRAMUSCULAR | Status: AC | PRN
  Administered 2017-10-16: 13.33 mg

## 2017-10-16 NOTE — Progress Notes (Signed)
Office Visit Note   Patient: Marie Ramirez           Date of Birth: 09/22/1945           MRN: [id number] Visit Date: 10/16/2017              Requested by: [name], MD [place], [place] [address] PCP: [name], MD   Assessment & Plan: Visit Diagnoses:  1. Carpal tunnel syndrome, left upper limb     Plan: Left carpal tunnel syndrome.  Will inject the carpal canal with cortisone.  Continue with volar wrist splint.  Consider surgical decompression sometime in December per patient  Follow-Up Instructions: Return if symptoms worsen or fail to improve.   Orders:  Orders Placed This Encounter  Procedures  . Hand/UE Inj: L carpal tunnel   No orders of the defined types were placed in this encounter.     Procedures: Hand/UE Inj: L carpal tunnel for carpal tunnel syndrome on 10/16/2017 3:39 PM Details: 27 G needle, volar approach Medications: 0.3 mL lidocaine (PF) 1 %; 13.33 mg methylPREDNISolone acetate 40 MG/ML      Clinical Data: No additional findings.   Subjective: Chief Complaint  Patient presents with  . New Patient (Initial Visit)    L CTS WANT INJECTION,L HAND WAKES HER GOES NUMB, USING ICE AND BRACE BRACE DOSENT HELP. HAS HX OF BI LAT CTS FROM 2007 DR MEYERS OPERATED ON IT   Mrs. Quade has a history of bilateral carpal tunnel syndrome.  She underwent successful carpal tunnel release several years ago and has done well.  She continues to have problems with her left wrist with numbness and tingling which seems to be "getting worse".  She has tried over-the-counter medicines and splinting.  She plays the piano and does other repetitive activities with a computer and has "lots of numbness and tingling" into the radial 3 digits.  This has caused compromise of her activities  HPI  Review of Systems  Constitutional: Negative for fatigue and fever.  HENT: Negative for ear pain.   Eyes: Negative for pain.  Respiratory: Negative for cough  and shortness of breath.   Cardiovascular: Negative for leg swelling.  Gastrointestinal: Negative for constipation and diarrhea.  Genitourinary: Negative for difficulty urinating.  Musculoskeletal: Negative for back pain and neck pain.  Skin: Negative for rash.  Allergic/Immunologic: Negative for food allergies.  Neurological: Positive for weakness. Negative for numbness.  Hematological: Does not bruise/bleed easily.  Psychiatric/Behavioral: Positive for sleep disturbance.     Objective: Vital Signs: BP (!) 152/86 (BP Location: Left Arm, Patient Position: Sitting, Cuff Size: Normal)   Pulse 69   Ht 5\' 3"  (1.6 m)   Wt 193 lb (87.5 kg)   BMI 34.19 kg/m   Physical Exam  Constitutional: She is oriented to person, place, and time. She appears well-developed and well-nourished.  HENT:  Mouth/Throat: Oropharynx is clear and moist.  Eyes: Pupils are equal, round, and reactive to light. EOM are normal.  Pulmonary/Chest: Effort normal.  Neurological: She is alert and oriented to person, place, and time.  Skin: Skin is warm and dry.  Psychiatric: She has a normal mood and affect. Her behavior is normal.    Ortho Exam left hand with positive Tinel's over the median nerve.  Positive Phalen's.  Minimal atrophy of the thenar eminence.  Good opposition of thumb to little finger with good strength.  Full range of motion of fingers.  Good capillary refill.  Does have normal sensibility today.  Specialty Comments:  No specialty comments available.  Imaging: No results found.   PMFS History: Patient Active Problem List   Diagnosis Date Noted  . Carpal tunnel syndrome, left upper limb 10/16/2017  . Hyperlipidemia 04/04/2016  . Osteoarthritis of left knee 02/20/2016  . GERD (gastroesophageal reflux disease) 05/09/2015  . Osteopenia 03/15/2014  . Vaginitis, atrophic 02/24/2014  . Obesity 05/31/2011  . Hematuria 12/17/2006  . Essential hypertension 09/06/2006  . Allergic rhinitis  09/06/2006   Past Medical History:  Diagnosis Date  . Allergy   . Arthritis   . Chronic left shoulder pain 05/31/2011   Frozen shoulder history- did exercises and eventually resolved  . Hypertension   . Lichen planus 30/16/0109   Qualifier: Diagnosis of  By: Sherren Mocha MD, Jory Ee     Family History  Problem Relation Age of Onset  . Alzheimer's disease Mother   . Leukemia Father   . CVA Father   . Hypertension Father   . Hypertension Sister   . Colon cancer Neg Hx     Past Surgical History:  Procedure Laterality Date  . CARPAL TUNNEL RELEASE     rt  . CATARACT EXTRACTION W/ INTRAOCULAR LENS  IMPLANT, BILATERAL    . PARATHYROIDECTOMY  2004   1 gland removed  . PLANTAR FASCIA SURGERY  2005   left  . TONSILLECTOMY    . VAGINAL HYSTERECTOMY     Social History   Occupational History  . Not on file  Tobacco Use  . Smoking status: Never Smoker  . Smokeless tobacco: Never Used  Substance and Sexual Activity  . Alcohol use: No  . Drug use: No  . Sexual activity: Not on file

## 2017-10-29 ENCOUNTER — Ambulatory Visit: Payer: Medicare Other | Admitting: Family Medicine

## 2017-11-16 IMAGING — DX DG CHEST 2V
2 series · 2 of 2 positions shown · non-contrast
Comparison: No recent prior.

CLINICAL DATA: Cough.

EXAM:
CHEST  2 VIEW

[chest pa]
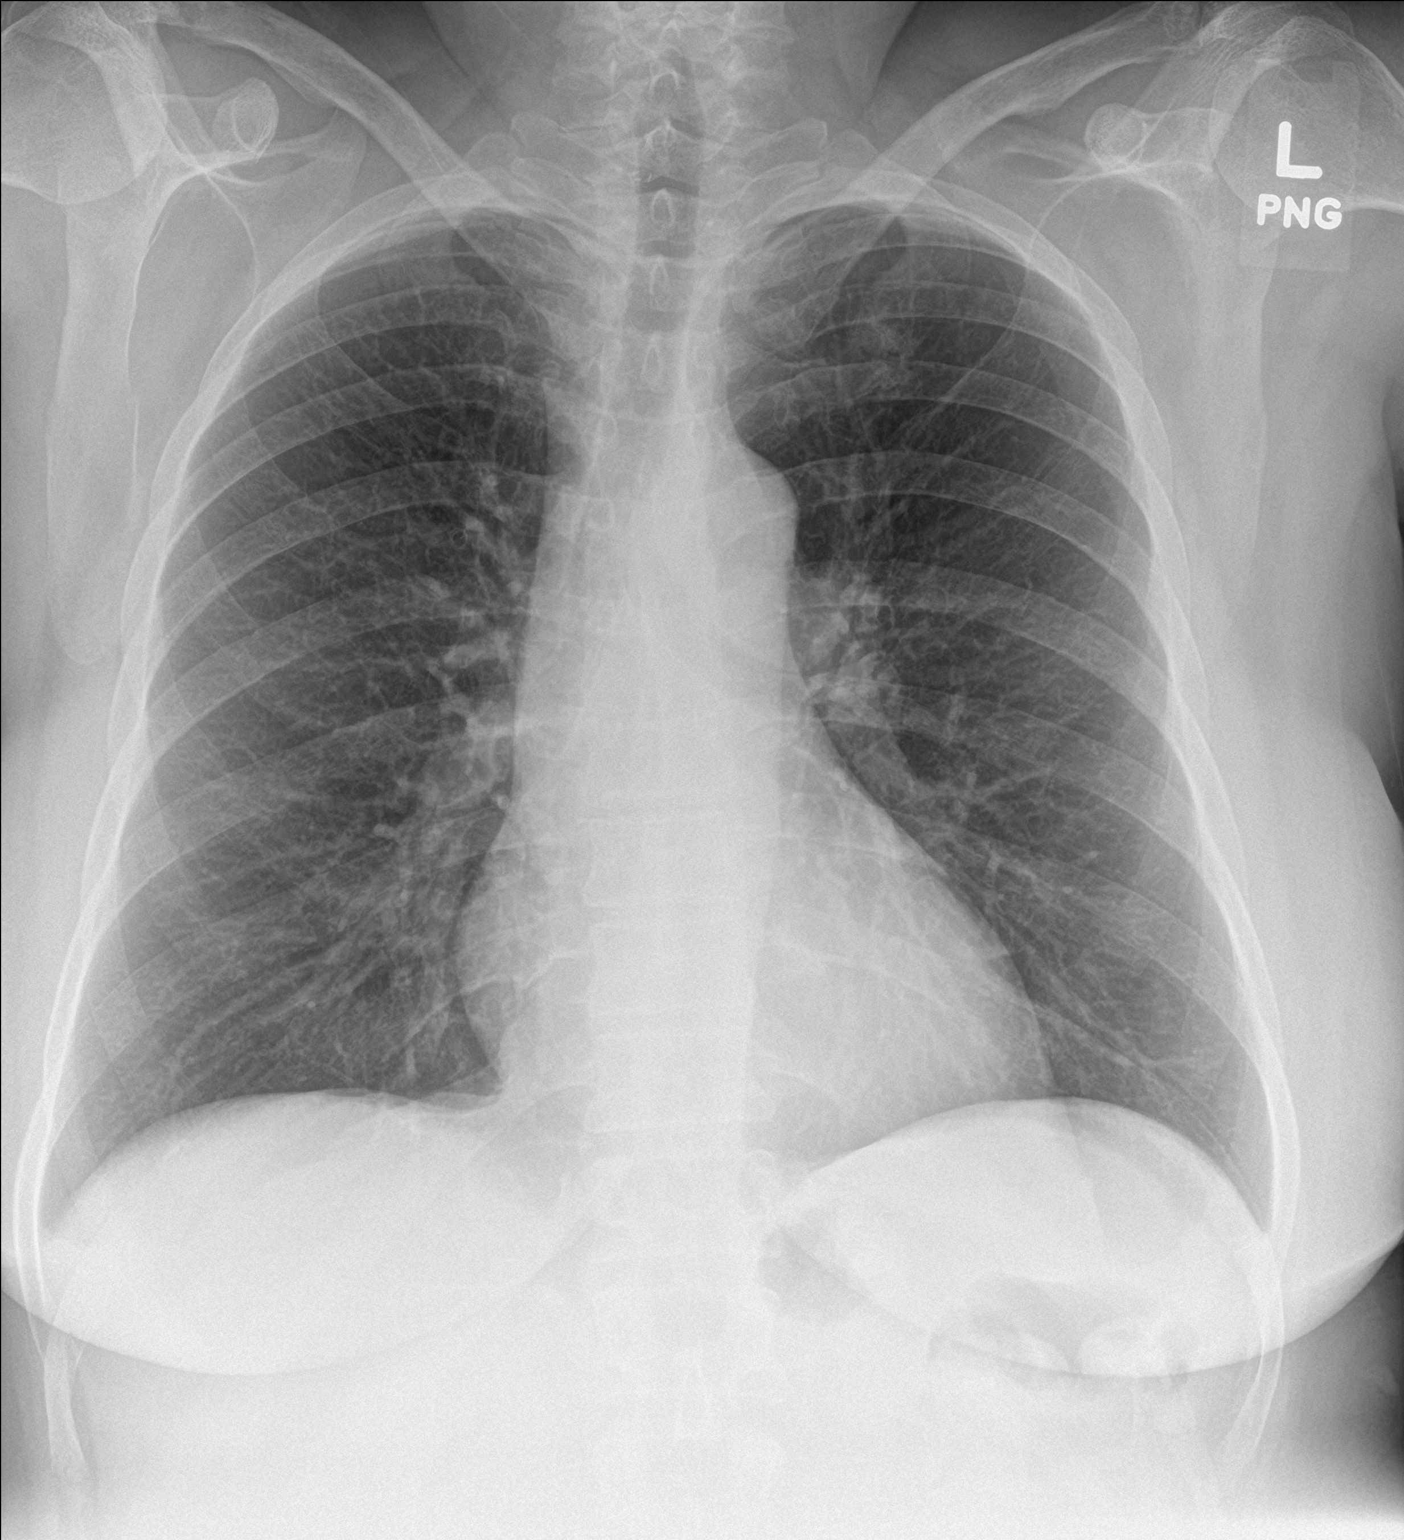

[chest lat]
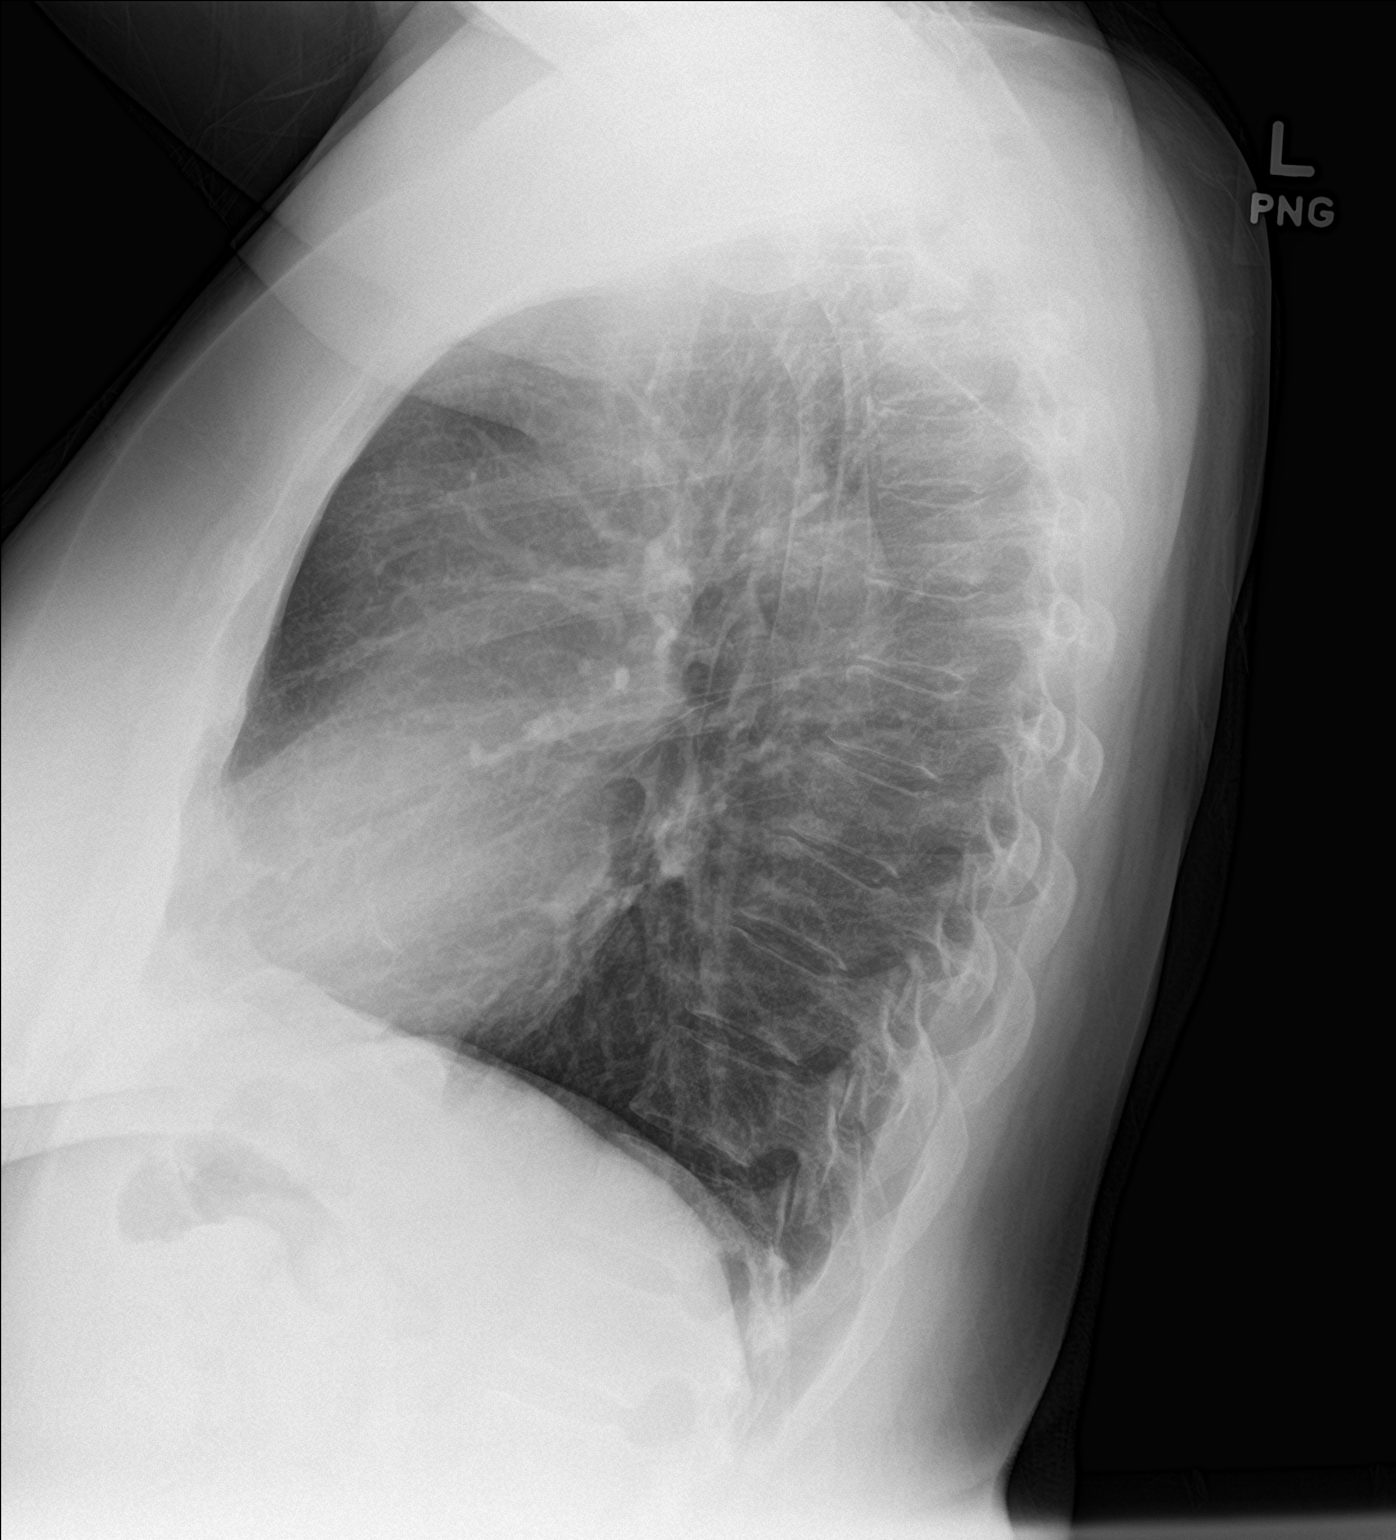

[2 of 2 positions shown; findings below may reference images not displayed]

FINDINGS: Mediastinum hilar structures normal. Lungs are clear. Mild
cardiomegaly with normal pulmonary vascularity . No pleural effusion
or pneumothorax. Degenerative changes thoracic spine.
IMPRESSION: Mild cardiomegaly with normal pulmonary vascularity. No focal
infiltrate .

## 2017-12-25 ENCOUNTER — Ambulatory Visit: Payer: Medicare Other

## 2017-12-25 ENCOUNTER — Ambulatory Visit (INDEPENDENT_AMBULATORY_CARE_PROVIDER_SITE_OTHER): Payer: Medicare Other | Admitting: Family Medicine

## 2017-12-25 ENCOUNTER — Encounter: Payer: Self-pay | Admitting: Family Medicine

## 2017-12-25 VITALS — BP 132/82 | HR 61 | Temp 97.8°F | Ht 63.0 in | Wt 187.8 lb

## 2017-12-25 DIAGNOSIS — I10 Essential (primary) hypertension: Secondary | ICD-10-CM | POA: Diagnosis not present

## 2017-12-25 DIAGNOSIS — M8588 Other specified disorders of bone density and structure, other site: Secondary | ICD-10-CM | POA: Diagnosis not present

## 2017-12-25 DIAGNOSIS — N183 Chronic kidney disease, stage 3 unspecified: Secondary | ICD-10-CM | POA: Insufficient documentation

## 2017-12-25 DIAGNOSIS — R319 Hematuria, unspecified: Secondary | ICD-10-CM

## 2017-12-25 DIAGNOSIS — E785 Hyperlipidemia, unspecified: Secondary | ICD-10-CM

## 2017-12-25 LAB — CBC
HCT: 44.8 % (ref 36.0–46.0)
Hemoglobin: 15.2 g/dL — ABNORMAL HIGH (ref 12.0–15.0)
MCHC: 34 g/dL (ref 30.0–36.0)
MCV: 88.8 fl (ref 78.0–100.0)
Platelets: 377 10*3/uL (ref 150.0–400.0)
RBC: 5.05 Mil/uL (ref 3.87–5.11)
RDW: 12.4 % (ref 11.5–15.5)
WBC: 6.4 10*3/uL (ref 4.0–10.5)

## 2017-12-25 LAB — LIPID PANEL
Cholesterol: 242 mg/dL — ABNORMAL HIGH (ref 0–200)
HDL: 45.8 mg/dL (ref >39.00)
LDL Cholesterol: 163 mg/dL — ABNORMAL HIGH (ref 0–99)
NonHDL: 195.99
TRIGLYCERIDES: 167 mg/dL — AB (ref 0.0–149.0)
Total CHOL/HDL Ratio: 5
VLDL: 33.4 mg/dL (ref 0.0–40.0)

## 2017-12-25 LAB — COMPREHENSIVE METABOLIC PANEL
ALT: 16 U/L (ref 0–35)
AST: 19 U/L (ref 0–37)
Albumin: 4.4 g/dL (ref 3.5–5.2)
Alkaline Phosphatase: 63 U/L (ref 39–117)
BUN: 24 mg/dL — ABNORMAL HIGH (ref 6–23)
CHLORIDE: 103 meq/L (ref 96–112)
CO2: 28 mEq/L (ref 19–32)
Calcium: 10.5 mg/dL (ref 8.4–10.5)
Creatinine, Ser: 1.23 mg/dL — ABNORMAL HIGH (ref 0.40–1.20)
GFR: 45.75 mL/min — ABNORMAL LOW (ref >60.00)
Glucose, Bld: 100 mg/dL — ABNORMAL HIGH (ref 70–99)
Potassium: 4.3 mEq/L (ref 3.5–5.1)
Sodium: 141 mEq/L (ref 135–145)
Total Bilirubin: 0.7 mg/dL (ref 0.2–1.2)
Total Protein: 7.6 g/dL (ref 6.0–8.3)

## 2017-12-25 LAB — URINALYSIS, ROUTINE W REFLEX MICROSCOPIC
Bilirubin Urine: NEGATIVE
Ketones, ur: NEGATIVE
Leukocytes, UA: NEGATIVE
Nitrite: NEGATIVE
Specific Gravity, Urine: 1.015 (ref 1.000–1.030)
Total Protein, Urine: NEGATIVE
Urine Glucose: NEGATIVE
Urobilinogen, UA: 0.2 (ref 0.0–1.0)
pH: 5.5 (ref 5.0–8.0)

## 2017-12-25 NOTE — Assessment & Plan Note (Signed)
S: Poorly controlled on no statin-using last years labs ten-year risk was 18% Lab Results  Component Value Date   CHOL 229 (H) 04/02/2016   HDL 43.70 04/02/2016   LDLCALC 157 (H) 04/02/2016   LDLDIRECT 158.3 02/17/2013   TRIG 142.0 04/02/2016   CHOLHDL 5 04/02/2016   A/P: Will update lipids-strongly suspect will recommend statin- perhaps once a week

## 2017-12-25 NOTE — Assessment & Plan Note (Signed)
Noted 2008. Dr. Karsten Ro- CT scan and cystoscopy. No clear cause. Told to return only with significant microscopic hematuria or gross hematuria - update urinalysis today

## 2017-12-25 NOTE — Progress Notes (Signed)
Subjective:  Marie Ramirez is a 72 y.o. year old very pleasant female patient who presents for/with See problem oriented charting ROS- No chest pain or shortness of breath. No headache or blurry vision.    Past Medical History-  Patient Active Problem List   Diagnosis Date Noted  . CKD (chronic kidney disease), stage III ([facility]) 12/25/2017    Priority: Medium  . Hyperlipidemia 04/04/2016    Priority: Medium  . GERD (gastroesophageal reflux disease) 05/09/2015    Priority: Medium  . Osteopenia 03/15/2014    Priority: Medium  . Vaginitis, atrophic 02/24/2014    Priority: Medium  . Hematuria 12/17/2006    Priority: Medium  . Essential hypertension 09/06/2006    Priority: Medium  . Osteoarthritis of left knee 02/20/2016    Priority: Low  . Obesity 05/31/2011    Priority: Low  . Allergic rhinitis 09/06/2006    Priority: Low  . Carpal tunnel syndrome, left upper limb 10/16/2017    Medications- reviewed and updated Current Outpatient Medications  Medication Sig Dispense Refill  . aspirin 81 MG tablet Take 81 mg by mouth daily.    Marland Kitchen atenolol-chlorthalidone (TENORETIC) 50-25 MG tablet TAKE 1/2 TABLET BY MOUTH EVERY MORNING FOR HIGH BLOOD PRESSURE 100 tablet 1  . calcium citrate-vitamin D (CITRACAL+D) 315-200 MG-UNIT per tablet Take 1 tablet by mouth daily.    . fexofenadine (ALLEGRA) 180 MG tablet Take 180 mg by mouth daily.    . fluticasone (FLONASE) 50 MCG/ACT nasal spray PLACE 2 SPRAYS IN EACH NOSTRIL DAILY 32 g 5  . ibuprofen (ADVIL,MOTRIN) 100 MG tablet Take 200 mg by mouth daily.     Marland Kitchen omeprazole (PRILOSEC) 20 MG capsule Take 1 capsule (20 mg total) by mouth daily. (Patient not taking: Reported on 12/25/2017) 100 capsule 3   No current facility-administered medications for this visit.     Objective: BP 132/82   Pulse 61   Temp 97.8 F (36.6 C) (Oral)   Ht 5\' 3"  (1.6 m)   Wt 187 lb 12.8 oz (85.2 kg)   SpO2 98%   BMI 33.27 kg/m  Gen: NAD, resting comfortably CV: RRR no  murmurs rubs or gallops Lungs: CTAB no crackles, wheeze, rhonchi Abdomen: soft/nontender/nondistended/normal bowel sounds. No rebound or guarding.  Ext: no edema Skin: warm, dry Neuro: grossly normal, moves all extremities  Assessment/Plan:  Other notes: 1.carpal tunnel surgery tomorrow 2. Some cramping in legs at night- toothache type pain. Has varicose veins- discussed using compression stockings    Essential hypertension S: controlled on repeat on half tablet of atenolol-chlorthalidone 50-25 mg BP Readings from Last 3 Encounters:  12/25/17 132/82  10/16/17 (!) 152/86  08/29/16 (!) 142/91  A/P: We discussed blood pressure goal of <140/90. Continue current meds   CKD (chronic kidney disease), stage III (HCC) S: Chronic kidney disease stage III with GFR right around 50-she knows to avoid NSAIDs after discussion today.  We discussed trying to lower blood pressure in light of her kidney function A/P: hopefully stable- update cmet today  Hyperlipidemia S: Poorly controlled on no statin-using last years labs ten-year risk was 18% Lab Results  Component Value Date   CHOL 229 (H) 04/02/2016   HDL 43.70 04/02/2016   LDLCALC 157 (H) 04/02/2016   LDLDIRECT 158.3 02/17/2013   TRIG 142.0 04/02/2016   CHOLHDL 5 04/02/2016   A/P: Will update lipids-strongly suspect will recommend statin- perhaps once a week  Osteopenia S: Patient compliant with calcium and vitamin D- in 2016 had a bone  density showing worst T score -1.2 and lumbar spine A/P: hopefully stable- Recommended updated bone density   Hematuria Noted 2008. Dr. Karsten Ro- CT scan and cystoscopy. No clear cause. Told to return only with significant microscopic hematuria or gross hematuria - update urinalysis today   Future Appointments  Date Time Provider [place]  12/31/2017 11:00 AM [facility]  01/01/2018 10:00 AM Garald Balding, MD [facility] None   Advised 17-month follow-up  Lab/Order  associations: Essential hypertension - Plan: CBC, Comprehensive metabolic panel, Lipid panel  CKD (chronic kidney disease), stage III (HCC)  Hyperlipidemia, unspecified hyperlipidemia type - Plan: CBC, Comprehensive metabolic panel, Lipid panel  Osteopenia of spine - Plan: DG Bone Density  Hematuria, unspecified type - Plan: Urinalysis  Return precautions advised.  [name], MD

## 2017-12-25 NOTE — Assessment & Plan Note (Signed)
S: Chronic kidney disease stage III with GFR right around 50-she knows to avoid NSAIDs after discussion today.  We discussed trying to lower blood pressure in light of her kidney function A/P: hopefully stable- update cmet today

## 2017-12-25 NOTE — Addendum Note (Signed)
Addended by: Kayren Eaves T on: 12/25/2017 11:31 AM   Modules accepted: Orders

## 2017-12-25 NOTE — Patient Instructions (Addendum)
Schedule your bone density test at check out desk. You may also call directly to X-ray at [phone] to schedule an appointment that is convenient for you.  - located [address] across the street from [place] - in the basement - you do need an appointment for the bone density tests.   Best of luck on your surgery tomorrow  Please stop by lab before you go

## 2017-12-25 NOTE — Assessment & Plan Note (Signed)
S: controlled on repeat on half tablet of atenolol-chlorthalidone 50-25 mg BP Readings from Last 3 Encounters:  12/25/17 132/82  10/16/17 (!) 152/86  08/29/16 (!) 142/91  A/P: We discussed blood pressure goal of <140/90. Continue current meds

## 2017-12-25 NOTE — Assessment & Plan Note (Signed)
S: Patient compliant with calcium and vitamin D- in 2016 had a bone density showing worst T score -1.2 and lumbar spine A/P: hopefully stable- Recommended updated bone density

## 2017-12-26 ENCOUNTER — Telehealth: Payer: Self-pay | Admitting: Family Medicine

## 2017-12-26 ENCOUNTER — Other Ambulatory Visit: Payer: Self-pay

## 2017-12-26 DIAGNOSIS — G5602 Carpal tunnel syndrome, left upper limb: Secondary | ICD-10-CM | POA: Diagnosis not present

## 2017-12-26 DIAGNOSIS — R319 Hematuria, unspecified: Secondary | ICD-10-CM

## 2017-12-26 MED ORDER — ATORVASTATIN CALCIUM 20 MG PO TABS: 20.0000 mg | ORAL_TABLET | ORAL | 3 refills | Status: DC

## 2017-12-26 NOTE — Telephone Encounter (Signed)
Spoke with patient regarding her results

## 2017-12-26 NOTE — Telephone Encounter (Signed)
  Copied from [place] [phone]. Topic: Quick Communication - Lab Results (Clinic Use ONLY) >> Dec 25, 2017  4:20 PM [place], [place], [place] wrote: Called patient to inform them of 12/25/2017 lab results. When patient returns call, triage nurse may disclose results.   Patient is calling and states she received a call from [place] with her lab results and was told if she had any further questions to please reach out. She states she has questions and would prefer to speak with Roselyn Reef. Please contact.  [phone]

## 2017-12-27 ENCOUNTER — Other Ambulatory Visit: Payer: Self-pay

## 2017-12-27 ENCOUNTER — Encounter: Payer: Self-pay | Admitting: Family Medicine

## 2017-12-27 MED ORDER — FAMOTIDINE 20 MG PO TABS: 20.0000 mg | ORAL_TABLET | Freq: Two times a day (BID) | ORAL | 3 refills | Status: AC

## 2017-12-31 ENCOUNTER — Telehealth: Payer: Self-pay

## 2017-12-31 ENCOUNTER — Ambulatory Visit (INDEPENDENT_AMBULATORY_CARE_PROVIDER_SITE_OTHER): Payer: Medicare Other

## 2017-12-31 ENCOUNTER — Ambulatory Visit: Payer: Medicare Other

## 2017-12-31 VITALS — BP 120/96 | HR 58 | Temp 98.1°F | Ht 63.0 in | Wt 192.2 lb

## 2017-12-31 DIAGNOSIS — Z0000 Encounter for general adult medical examination without abnormal findings: Secondary | ICD-10-CM | POA: Diagnosis not present

## 2017-12-31 DIAGNOSIS — R319 Hematuria, unspecified: Secondary | ICD-10-CM

## 2017-12-31 NOTE — Progress Notes (Signed)
PCP notes: Last OV 12/25/2017   Health maintenance: Up to Date   Abnormal screenings: None   Patient concerns: Pt recently had new diagnosis of CKDIII. She is very upset about the diagnosis. She is having more labs done today. I told her we would call with the results. She verbalized understanding.  Patient's left wrist is in a large cast as she recently had a Carpal Tunnel release surgery. She is scheduled for a post op visit tomorrow   Nurse concerns:None   Next PCP appt: 06/27/2018

## 2017-12-31 NOTE — Telephone Encounter (Signed)
PA initiated

## 2017-12-31 NOTE — Addendum Note (Signed)
Addended by: Kayren Eaves T on: 12/31/2017 04:20 PM   Modules accepted: Orders

## 2017-12-31 NOTE — Progress Notes (Signed)
I have personally reviewed the Medicare Annual Wellness Visit and agree with the assessment and plan.  Algis Greenhouse. Jerline Pain, MD 12/31/2017 5:02 PM

## 2017-12-31 NOTE — Progress Notes (Signed)
Subjective:   Marie Ramirez is a 72 y.o. female who presents for Medicare Annual (Subsequent) preventive examination.  Review of Systems:  No ROS.  Medicare Wellness Visit. Additional risk factors are reflected in the social history. Cardiac Risk Factors include: advanced age (>26men, >18 women) Patient lives with husband Wille Glaser of 98 years in a  2 story home. They currently do not have any pets. They have 2 children that live within a 1 mile radius. 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   She goes to bed around 11-12. She gets up between 7-8am. She occasionally gets up to go to the bathroom but mostly sleeps through the night. She feels rested when she wakes up in the morning.    Objective:     Vitals: BP (!) 120/96 (BP Location: Right Arm, Patient Position: Sitting, Cuff Size: Large) Comment: Patient is very upset regarding her Kidney Disease diagnosis  Pulse (!) 58   Temp 98.1 F (36.7 C) (Oral)   Ht 5\' 3"  (1.6 m)   Wt 192 lb 3.2 oz (87.2 kg)   SpO2 98%   BMI 34.05 kg/m   Body mass index is 34.05 kg/m.  Advanced Directives 12/31/2017 04/02/2016 06/02/2015  Does Patient Have a Medical Advance Directive? No No No  Would patient like information on creating a medical advance directive? No - Patient declined - -    Tobacco Social History   Tobacco Use  Smoking Status Never Smoker  Smokeless Tobacco Never Used     Counseling given: Not Answered   Past Medical History:  Diagnosis Date  . Allergy   . Arthritis   . Carpal tunnel syndrome of left wrist   . Carpal tunnel syndrome of right wrist   . Chronic left shoulder pain 05/31/2011   Frozen shoulder history- did exercises and eventually resolved  . Hypertension   . Lichen planus 25/36/6440   Qualifier: Diagnosis of  By: Sherren Mocha MD, Jory Ee    Past Surgical History:  Procedure Laterality Date  . CARPAL TUNNEL RELEASE     rt    . CATARACT EXTRACTION W/ INTRAOCULAR LENS  IMPLANT, BILATERAL    . PARATHYROIDECTOMY  2004   1 gland removed  . PLANTAR FASCIA SURGERY  2005   left  . TONSILLECTOMY    . VAGINAL HYSTERECTOMY     Family History  Problem Relation Age of Onset  . Alzheimer's disease Mother   . Leukemia Father   . CVA Father   . Hypertension Father   . Hypertension Sister   . Plantar fasciitis Daughter   . Diverticulitis Son   . Lymphoma Maternal Grandmother   . Early death Maternal Grandfather   . Stroke Paternal Grandmother   . Dementia Paternal Grandmother   . Stroke Paternal Grandfather   . Hypertension Paternal Grandfather   . Colon cancer Neg Hx    Social History   Socioeconomic History  . Marital status: Married    Spouse name: Not on file  . Number of children: Not on file  . Years of education: Not on file  . Highest education level: Not on file  Occupational History  . Not on file  Social Needs  . Financial resource strain: Not on file  . Food insecurity:    Worry: Not on file    Inability: Not on file  . Transportation needs:    Medical: Not on  file    Non-medical: Not on file  Tobacco Use  . Smoking status: Never Smoker  . Smokeless tobacco: Never Used  Substance and Sexual Activity  . Alcohol use: No  . Drug use: No  . Sexual activity: Yes  Lifestyle  . Physical activity:    Days per week: Not on file    Minutes per session: Not on file  . Stress: Not on file  Relationships  . Social connections:    Talks on phone: Not on file    Gets together: Not on file    Attends religious service: Not on file    Active member of club or organization: Not on file    Attends meetings of clubs or organizations: Not on file    Relationship status: Not on file  Other Topics Concern  . Not on file  Social History Narrative   Married. Husband Marie Ramirez. 2 kids by first marriage. 2 step kids. 7 grandkids.       Retired Loss adjuster, [employer]    Outpatient  Encounter Medications as of 12/31/2017  Medication Sig  . aspirin 81 MG tablet Take 81 mg by mouth daily.  Marland Kitchen atenolol-chlorthalidone (TENORETIC) 50-25 MG tablet TAKE 1/2 TABLET BY MOUTH EVERY MORNING FOR HIGH BLOOD PRESSURE  . atorvastatin (LIPITOR) 20 MG tablet Take 1 tablet (20 mg total) by mouth once a week.  . calcium citrate-vitamin D (CITRACAL+D) 315-200 MG-UNIT per tablet Take 1 tablet by mouth daily.  . famotidine (PEPCID) 20 MG tablet Take 1 tablet (20 mg total) by mouth 2 (two) times daily.  . fexofenadine (ALLEGRA) 180 MG tablet Take 180 mg by mouth daily.  . fluticasone (FLONASE) 50 MCG/ACT nasal spray PLACE 2 SPRAYS IN EACH NOSTRIL DAILY  . ibuprofen (ADVIL,MOTRIN) 100 MG tablet Take 200 mg by mouth daily.    No facility-administered encounter medications on file as of 12/31/2017.     Activities of Daily Living In your present state of health, do you have any difficulty performing the following activities: 12/31/2017  Hearing? N  Vision? N  Difficulty concentrating or making decisions? N  Walking or climbing stairs? N  Dressing or bathing? N  Doing errands, shopping? N  Preparing Food and eating ? N  Using the Toilet? N  In the past six months, have you accidently leaked urine? N  Do you have problems with loss of bowel control? N  Managing your Medications? N  Managing your Finances? N  Housekeeping or managing your Housekeeping? N  Some recent data might be hidden    Patient Care Team: [name], MD as PCP - General (Family Medicine)    Assessment:   This is a routine wellness examination for Marie Ramirez.  Exercise Activities and Dietary recommendations Current Exercise Habits: Structured exercise class(Joining [employer] in January. [place], [place]), Type of exercise: walking, Intensity: Mild, Exercise limited by: None identified   Breakfast: Coffee, bowl of cereal, half of an egg sandwich  Lunch: half a sandwich, chicken salad on Lettuce, left overs,  normally drinks water  Dinner: Chicken and slaw, baked potato, water to drink  Occasionally drinks unsweet tea and is down to 1 diet soda a day Goals    . Exercise 150 min/wk Moderate Activity     Joining [employer] January 22, 2018. Plans to join the [place], [employer]    . Weight (lb) < 180 lb (81.6 kg)     Check out  online nutrition programs as GumSearch.nl and http://vang.com/;  fit54me;  Look for foods with "whole" wheat; bran; oatmeal etc Shot at the farmer's markets in season for fresher choices  Watch for "hydrogenated" on the label of oils which are trans-fats.  Watch for "high fructose corn syrup" in snacks, yogurt or ketchup  Meats have less marbling; bright colored fruits and vegetables;  Canned; dump out liquid and wash vegetables. Be mindful of what we are eating  Portion control is essential to a health weight! Sit down; take a break and enjoy your meal; take smaller bites; put the fork down between bites;  It takes 20 minutes to get full; so check in with your fullness cues and stop eating when you start to fill full              Fall Risk Fall Risk  12/31/2017 12/25/2017 04/02/2016 04/02/2016 09/21/2015  Falls in the past year? 0 0 Yes No No  Comment - - - - Emmi Telephone Survey: data to providers prior to load  Number falls in past yr: - - 1 - -  Comment - - [place] at the beach but had flip flops on; now does not wear them - -    Depression Screen PHQ 2/9 Scores 12/31/2017 12/25/2017 04/02/2016 05/09/2015  PHQ - 2 Score 0 0 0 0     Cognitive Function MMSE - Mini Mental State Exam 04/02/2016  Not completed: (No Data)     6CIT Screen 12/31/2017  What Year? 0 points  What month? 0 points  What time? 0 points  Count back from 20 0 points  Months in reverse 0 points  Repeat phrase 0 points  Total Score 0    Immunization History  Administered Date(s) Administered  . Influenza Split 10/22/2012  . Influenza Whole 01/22/2005, 10/23/2006,  10/22/2008, 10/22/2009  . Influenza-Unspecified 10/22/2013, 11/09/2015  . Pneumococcal Polysaccharide-23 12/02/2007  . Td 01/22/2001  . Tetanus 02/17/2013      Screening Tests Health Maintenance  Topic Date Due  . PNA vac Low Risk Adult (1 of 2 - PCV13) 12/13/2018 (Originally 01/12/2012)  . MAMMOGRAM  10/10/2019  . TETANUS/TDAP  02/18/2023  . COLONOSCOPY  03/05/2023  . INFLUENZA VACCINE  Completed  . DEXA SCAN  Completed  . Hepatitis C Screening  Completed        Plan:  Follow Up with PCP as advised   I have personally reviewed and noted the following in the patient's chart:   . Medical and social history . Use of alcohol, tobacco or illicit drugs  . Current medications and supplements . Functional ability and status . Nutritional status . Physical activity . Advanced directives . List of other physicians . Vitals . Screenings to include cognitive, depression, and falls . Referrals and appointments  In addition, I have reviewed and discussed with patient certain preventive protocols, quality metrics, and best practice recommendations. A written personalized care plan for preventive services as well as general preventive health recommendations were provided to patient.     [place], [place]  08/67/[id number]

## 2017-12-31 NOTE — Patient Instructions (Addendum)
Marie Ramirez , Thank you for taking time to come for your Medicare Wellness Visit. I appreciate your ongoing commitment to your health goals. Please review the following plan we discussed and let me know if I can assist you in the future.   These are the goals we discussed: Goals    . Exercise 150 min/wk Moderate Activity     Joining [employer] January 22, 2018. Plans to join the [place], [employer]    . Weight (lb) < 180 lb (81.6 kg)     Check out  online nutrition programs as GumSearch.nl and http://vang.com/; fit46me;  Look for foods with "whole" wheat; bran; oatmeal etc Shot at the farmer's markets in season for fresher choices  Watch for "hydrogenated" on the label of oils which are trans-fats.  Watch for "high fructose corn syrup" in snacks, yogurt or ketchup  Meats have less marbling; bright colored fruits and vegetables;  Canned; dump out liquid and wash vegetables. Be mindful of what we are eating  Portion control is essential to a health weight! Sit down; take a break and enjoy your meal; take smaller bites; put the fork down between bites;  It takes 20 minutes to get full; so check in with your fullness cues and stop eating when you start to fill full              This is a list of the screening recommended for you and due dates:  Health Maintenance  Topic Date Due  . Pneumonia vaccines (1 of 2 - PCV13) 12/13/2018*  . Mammogram  10/10/2019  . Tetanus Vaccine  02/18/2023  . Colon Cancer Screening  03/05/2023  . Flu Shot  Completed  . DEXA scan (bone density measurement)  Completed  .  Hepatitis C: One time screening is recommended by Center for Disease Control  (CDC) for  adults born from [birth year] through 1964.   Completed  *Topic was postponed. The date shown is not the original due date.   Preventive Care for Adults  A healthy lifestyle and preventive care can promote health and wellness. Preventive health guidelines for adults include the following key  practices.  . A routine yearly physical is a good way to check with your health care provider about your health and preventive screening. It is a chance to share any concerns and updates on your health and to receive a thorough exam.  . Visit your dentist for a routine exam and preventive care every 6 months. Brush your teeth twice a day and floss once a day. Good oral hygiene prevents tooth decay and gum disease.  . The frequency of eye exams is based on your age, health, family medical history, use  of contact lenses, and other factors. Follow your health care provider's recommendations for frequency of eye exams.  . Eat a healthy diet. Foods like vegetables, fruits, whole grains, low-fat dairy products, and lean protein foods contain the nutrients you need without too many calories. Decrease your intake of foods high in solid fats, added sugars, and salt. Eat the right amount of calories for you. Get information about a proper diet from your health care provider, if necessary.  . Regular physical exercise is one of the most important things you can do for your health. Most adults should get at least 150 minutes of moderate-intensity exercise (any activity that increases your heart rate and causes you to sweat) each week. In addition, most adults need muscle-strengthening exercises on 2 or more days a  week.  Silver Sneakers may be a benefit available to you. To determine eligibility, you may visit the website: www.silversneakers.com or contact program at 925-802-7115 Mon-Fri between 8AM-8PM.   . Maintain a healthy weight. The body mass index (BMI) is a screening tool to identify possible weight problems. It provides an estimate of body fat based on height and weight. Your health care provider can find your BMI and can help you achieve or maintain a healthy weight.   For adults 20 years and older: ? A BMI below 18.5 is considered underweight. ? A BMI of 18.5 to 24.9 is normal. ? A BMI of 25 to  29.9 is considered overweight. ? A BMI of 30 and above is considered obese.   . Maintain normal blood lipids and cholesterol levels by exercising and minimizing your intake of saturated fat. Eat a balanced diet with plenty of fruit and vegetables. Blood tests for lipids and cholesterol should begin at age 67 and be repeated every 5 years. If your lipid or cholesterol levels are high, you are over 50, or you are at high risk for heart disease, you may need your cholesterol levels checked more frequently. Ongoing high lipid and cholesterol levels should be treated with medicines if diet and exercise are not working.  . If you smoke, find out from your health care provider how to quit. If you do not use tobacco, please do not start.  . If you choose to drink alcohol, please do not consume more than 2 drinks per day. One drink is considered to be 12 ounces (355 mL) of beer, 5 ounces (148 mL) of wine, or 1.5 ounces (44 mL) of liquor.  . If you are 58-70 years old, ask your health care provider if you should take aspirin to prevent strokes.  . Use sunscreen. Apply sunscreen liberally and repeatedly throughout the day. You should seek shade when your shadow is shorter than you. Protect yourself by wearing long sleeves, pants, a wide-brimmed hat, and sunglasses year round, whenever you are outdoors.  . Once a month, do a whole body skin exam, using a mirror to look at the skin on your back. Tell your health care provider of new moles, moles that have irregular borders, moles that are larger than a pencil eraser, or moles that have changed in shape or color.

## 2017-12-31 NOTE — Telephone Encounter (Signed)
PA Famotidine 20 mg, take 1 po BID  Faxed request received from [email or web address]

## 2018-01-01 ENCOUNTER — Encounter (INDEPENDENT_AMBULATORY_CARE_PROVIDER_SITE_OTHER): Payer: Self-pay | Admitting: Orthopaedic Surgery

## 2018-01-01 ENCOUNTER — Ambulatory Visit (INDEPENDENT_AMBULATORY_CARE_PROVIDER_SITE_OTHER): Payer: Medicare Other | Admitting: Orthopaedic Surgery

## 2018-01-01 VITALS — BP 132/82 | Ht 63.0 in | Wt 187.0 lb

## 2018-01-01 DIAGNOSIS — G5602 Carpal tunnel syndrome, left upper limb: Secondary | ICD-10-CM

## 2018-01-01 LAB — URINALYSIS, MICROSCOPIC ONLY: RBC / HPF: NONE SEEN (ref 0–?)

## 2018-01-01 NOTE — Telephone Encounter (Signed)
Marie Ramirez ([employer])   This request has received a Favorable outcome. Please note any additional information provided by [facility] at the bottom of your screen. You will also receive a faxed copy of the determination.

## 2018-01-01 NOTE — Progress Notes (Signed)
Office Visit Note   Patient: Marie Ramirez           Date of Birth: Feb 08, 1945           MRN: [id number] Visit Date: 01/01/2018              Requested by: [name], MD [place], [place] [address] PCP: [name], MD   Assessment & Plan: Visit Diagnoses:  1. Carpal tunnel syndrome, left upper limb     Plan: 6 days status post left carpal tunnel release and doing well.  No longer has the pain or burning or having to wake up at night.  Dressing removed.  New dressing applied to the clean incision. will wear volar wrist splint and return in a week for stitch removal  Follow-Up Instructions: Return in about 1 week (around 01/08/2018).   Orders:  No orders of the defined types were placed in this encounter.  No orders of the defined types were placed in this encounter.     Procedures: No procedures performed   Clinical Data: No additional findings.   Subjective: Chief Complaint  Patient presents with  . Left Wrist - Follow-up    12/26/17 Left Carpal Tunnel Release  Patient presents for first post op visit. She is status post left carpal tunnel release on 12/26/17.  She states that she is doing well. She is not having any pain and has not had to take pain medication.  The numbness and tingling she had prior to surgery is completely gone. Her sutures are intact and incision looks good.   HPI  Review of Systems   Objective: Vital Signs: BP 132/82   Ht 5\' 3"  (1.6 m)   Wt 187 lb (84.8 kg)   BMI 33.13 kg/m   Physical Exam  Ortho Exam left hand dressing removed.  Carpal tunnel incision healing without problem.  Normal sensation to fingers.  Able to oppose thumb the little finger.  Could make a full fist  Specialty Comments:  No specialty comments available.  Imaging: No results found.   PMFS History: Patient Active Problem List   Diagnosis Date Noted  . CKD (chronic kidney disease), stage III ([place]) 12/25/2017  . Carpal tunnel  syndrome, left upper limb 10/16/2017  . Hyperlipidemia 04/04/2016  . Osteoarthritis of left knee 02/20/2016  . GERD (gastroesophageal reflux disease) 05/09/2015  . Osteopenia 03/15/2014  . Vaginitis, atrophic 02/24/2014  . Obesity 05/31/2011  . Hematuria 12/17/2006  . Essential hypertension 09/06/2006  . Allergic rhinitis 09/06/2006   Past Medical History:  Diagnosis Date  . Allergy   . Arthritis   . Carpal tunnel syndrome of left wrist   . Carpal tunnel syndrome of right wrist   . Chronic left shoulder pain 05/31/2011   Frozen shoulder history- did exercises and eventually resolved  . Hypertension   . Lichen planus 33/00/7622   Qualifier: Diagnosis of  By: Sherren Mocha MD, Jory Ee     Family History  Problem Relation Age of Onset  . Alzheimer's disease Mother   . Leukemia Father   . CVA Father   . Hypertension Father   . Hypertension Sister   . Plantar fasciitis Daughter   . Diverticulitis Son   . Lymphoma Maternal Grandmother   . Early death Maternal Grandfather   . Stroke Paternal Grandmother   . Dementia Paternal Grandmother   . Stroke Paternal Grandfather   . Hypertension Paternal Grandfather   . Colon cancer Neg Hx  Past Surgical History:  Procedure Laterality Date  . CARPAL TUNNEL RELEASE     rt  . CATARACT EXTRACTION W/ INTRAOCULAR LENS  IMPLANT, BILATERAL    . PARATHYROIDECTOMY  2004   1 gland removed  . PLANTAR FASCIA SURGERY  2005   left  . TONSILLECTOMY    . VAGINAL HYSTERECTOMY     Social History   Occupational History  . Not on file  Tobacco Use  . Smoking status: Never Smoker  . Smokeless tobacco: Never Used  Substance and Sexual Activity  . Alcohol use: No  . Drug use: No  . Sexual activity: Yes

## 2018-01-09 ENCOUNTER — Encounter (INDEPENDENT_AMBULATORY_CARE_PROVIDER_SITE_OTHER): Payer: Self-pay | Admitting: Radiology

## 2018-01-09 ENCOUNTER — Telehealth: Payer: Self-pay

## 2018-01-09 ENCOUNTER — Ambulatory Visit (INDEPENDENT_AMBULATORY_CARE_PROVIDER_SITE_OTHER): Payer: Medicare Other | Admitting: Radiology

## 2018-01-09 ENCOUNTER — Inpatient Hospital Stay (INDEPENDENT_AMBULATORY_CARE_PROVIDER_SITE_OTHER): Payer: Medicare Other | Admitting: Orthopaedic Surgery

## 2018-01-09 VITALS — Ht 63.0 in | Wt 187.0 lb

## 2018-01-09 DIAGNOSIS — G5602 Carpal tunnel syndrome, left upper limb: Secondary | ICD-10-CM

## 2018-01-09 NOTE — Progress Notes (Signed)
Patient presented to [facility] office for suture removal today. She is status post left carpal tunnel release on 12/26/17. Sutures are removed and steri strips applied.

## 2018-01-09 NOTE — Telephone Encounter (Signed)
Prior Authorization received for famotidine (PEPCID) 20 MG tablet through 01/22/2019 under your Medicare Part D

## 2018-01-29 ENCOUNTER — Ambulatory Visit (INDEPENDENT_AMBULATORY_CARE_PROVIDER_SITE_OTHER): Payer: Medicare Other | Admitting: Orthopaedic Surgery

## 2018-01-29 ENCOUNTER — Encounter (INDEPENDENT_AMBULATORY_CARE_PROVIDER_SITE_OTHER): Payer: Self-pay | Admitting: Orthopaedic Surgery

## 2018-01-29 VITALS — BP 138/85 | HR 65 | Ht 63.0 in | Wt 187.0 lb

## 2018-01-29 DIAGNOSIS — G5602 Carpal tunnel syndrome, left upper limb: Secondary | ICD-10-CM

## 2018-01-29 NOTE — Progress Notes (Signed)
Office Visit Note   Patient: Marie Ramirez           Date of Birth: 1945-06-20           MRN: [id number] Visit Date: 01/29/2018              Requested by: [name], MD [place], [place] [address] PCP: [name], MD   Assessment & Plan: Visit Diagnoses:  1. Carpal tunnel syndrome, left upper limb     Plan: 1 month status post left carpal tunnel release.  Doing very well.  Will massage scar continue with her exercises and return to see me as needed.  Patient is very happy with the results.  Discontinue the splint perform activities as tolerated  Follow-Up Instructions: Return if symptoms worsen or fail to improve.   Orders:  No orders of the defined types were placed in this encounter.  No orders of the defined types were placed in this encounter.     Procedures: No procedures performed   Clinical Data: No additional findings.   Subjective: Chief Complaint  Patient presents with  . Left Hand - Follow-up    12/26/17 Left Carpal Tunnel Release  Patient presents for follow up. She is status post left carpal tunnel release on 12/26/17.  She is doing well.  She states that her incision has been a little tender since the sutures were removed. She does have some slight swelling. She continues to wear her wrist splint.   HPI  Review of Systems   Objective: Vital Signs: BP 138/85   Pulse 65   Ht 5\' 3"  (1.6 m)   Wt 187 lb (84.8 kg)   BMI 33.13 kg/m   Physical Exam  Ortho Exam carpal tunnel incision healing with some mild induration but no significant pain.  Will apply med derma or vitamin E.  No swelling of her fingers.  Neurovascular exam intact.  Good opposition of thumb to little finger.    Specialty Comments:  No specialty comments available.  Imaging: No results found.   PMFS History: Patient Active Problem List   Diagnosis Date Noted  . CKD (chronic kidney disease), stage III ([place]) 12/25/2017  . Carpal tunnel syndrome,  left upper limb 10/16/2017  . Hyperlipidemia 04/04/2016  . Osteoarthritis of left knee 02/20/2016  . GERD (gastroesophageal reflux disease) 05/09/2015  . Osteopenia 03/15/2014  . Vaginitis, atrophic 02/24/2014  . Obesity 05/31/2011  . Hematuria 12/17/2006  . Essential hypertension 09/06/2006  . Allergic rhinitis 09/06/2006   Past Medical History:  Diagnosis Date  . Allergy   . Arthritis   . Carpal tunnel syndrome of left wrist   . Carpal tunnel syndrome of right wrist   . Chronic left shoulder pain 05/31/2011   Frozen shoulder history- did exercises and eventually resolved  . Hypertension   . Lichen planus 02/54/2706   Qualifier: Diagnosis of  By: Sherren Mocha MD, Jory Ee     Family History  Problem Relation Age of Onset  . Alzheimer's disease Mother   . Leukemia Father   . CVA Father   . Hypertension Father   . Hypertension Sister   . Plantar fasciitis Daughter   . Diverticulitis Son   . Lymphoma Maternal Grandmother   . Early death Maternal Grandfather   . Stroke Paternal Grandmother   . Dementia Paternal Grandmother   . Stroke Paternal Grandfather   . Hypertension Paternal Grandfather   . Colon cancer Neg Hx  Past Surgical History:  Procedure Laterality Date  . CARPAL TUNNEL RELEASE     rt  . CATARACT EXTRACTION W/ INTRAOCULAR LENS  IMPLANT, BILATERAL    . PARATHYROIDECTOMY  2004   1 gland removed  . PLANTAR FASCIA SURGERY  2005   left  . TONSILLECTOMY    . VAGINAL HYSTERECTOMY     Social History   Occupational History  . Not on file  Tobacco Use  . Smoking status: Never Smoker  . Smokeless tobacco: Never Used  Substance and Sexual Activity  . Alcohol use: No  . Drug use: No  . Sexual activity: Yes

## 2018-04-07 ENCOUNTER — Other Ambulatory Visit: Payer: Self-pay

## 2018-04-07 ENCOUNTER — Ambulatory Visit (INDEPENDENT_AMBULATORY_CARE_PROVIDER_SITE_OTHER)
Admission: RE | Admit: 2018-04-07 | Discharge: 2018-04-07 | Disposition: A | Discharge status: Home / Self Care | Payer: Medicare Other | Source: Ambulatory Visit | Attending: Family Medicine | Admitting: Family Medicine

## 2018-04-07 DIAGNOSIS — M8588 Other specified disorders of bone density and structure, other site: Secondary | ICD-10-CM | POA: Diagnosis not present

## 2018-06-27 ENCOUNTER — Encounter: Payer: Self-pay | Admitting: Family Medicine

## 2018-06-27 ENCOUNTER — Ambulatory Visit (INDEPENDENT_AMBULATORY_CARE_PROVIDER_SITE_OTHER): Payer: Medicare Other | Admitting: Family Medicine

## 2018-06-27 VITALS — BP 118/80 | HR 55 | Temp 97.0°F | Ht 63.0 in | Wt 181.0 lb

## 2018-06-27 DIAGNOSIS — N183 Chronic kidney disease, stage 3 unspecified: Secondary | ICD-10-CM

## 2018-06-27 DIAGNOSIS — K219 Gastro-esophageal reflux disease without esophagitis: Secondary | ICD-10-CM

## 2018-06-27 DIAGNOSIS — E6609 Other obesity due to excess calories: Secondary | ICD-10-CM

## 2018-06-27 DIAGNOSIS — E785 Hyperlipidemia, unspecified: Secondary | ICD-10-CM

## 2018-06-27 DIAGNOSIS — I10 Essential (primary) hypertension: Secondary | ICD-10-CM

## 2018-06-27 NOTE — Patient Instructions (Signed)
Video visit

## 2018-06-27 NOTE — Progress Notes (Signed)
Phone [phone]   Subjective:  Virtual visit via Video note. Chief complaint: Chief Complaint  Patient presents with  . Hypertension    This visit type was conducted due to national recommendations for restrictions regarding the COVID-19 Pandemic (e.g. social distancing).  This format is felt to be most appropriate for this patient at this time balancing risks to patient and risks to population by having him in for in person visit.  No physical exam was performed (except for noted visual exam or audio findings with Telehealth visits).    Our team/I connected with Noah Delaine at  9:40 AM EDT by a video enabled telemedicine application (doxy.me or caregility through epic) and verified that I am speaking with the correct person using two identifiers.  Location patient: [facility] Location provider: [facility], office Persons participating in the virtual visit:  patient  Our team/I discussed the limitations of evaluation and management by telemedicine and the availability of in person appointments. In light of current covid-19 pandemic, patient also understands that we are trying to protect them by minimizing in office contact if at all possible.  The patient expressed consent for telemedicine visit and agreed to proceed. Patient understands insurance will be billed.   ROS- No fever, chills, cough, shortness of breath, body aches, sore throat, or loss of taste or smell. No known covid 19 contacts  Past Medical History-  Patient Active Problem List   Diagnosis Date Noted  . CKD (chronic kidney disease), stage III ([place]) 12/25/2017    Priority: Medium  . Hyperlipidemia 04/04/2016    Priority: Medium  . GERD (gastroesophageal reflux disease) 05/09/2015    Priority: Medium  . Osteopenia 03/15/2014    Priority: Medium  . Vaginitis, atrophic 02/24/2014    Priority: Medium  . Hematuria 12/17/2006    Priority: Medium  . Essential hypertension 09/06/2006    Priority: Medium  .  Osteoarthritis of left knee 02/20/2016    Priority: Low  . Obesity 05/31/2011    Priority: Low  . Allergic rhinitis 09/06/2006    Priority: Low  . Carpal tunnel syndrome, left upper limb 10/16/2017    Medications- reviewed and updated Current Outpatient Medications  Medication Sig Dispense Refill  . aspirin 81 MG tablet Take 81 mg by mouth daily.    Marland Kitchen atenolol-chlorthalidone (TENORETIC) 50-25 MG tablet TAKE 1/2 TABLET BY MOUTH EVERY MORNING FOR HIGH BLOOD PRESSURE 100 tablet 1  . atorvastatin (LIPITOR) 20 MG tablet Take 1 tablet (20 mg total) by mouth once a week. 13 tablet 3  . calcium citrate-vitamin D (CITRACAL+D) 315-200 MG-UNIT per tablet Take 1 tablet by mouth daily.    . famotidine (PEPCID) 20 MG tablet Take 1 tablet (20 mg total) by mouth 2 (two) times daily. 180 tablet 3  . fexofenadine (ALLEGRA) 180 MG tablet Take 180 mg by mouth daily.    . fluticasone (FLONASE) 50 MCG/ACT nasal spray PLACE 2 SPRAYS IN EACH NOSTRIL DAILY 32 g 5  . ibuprofen (ADVIL,MOTRIN) 100 MG tablet Take 200 mg by mouth daily.      No current facility-administered medications for this visit.      Objective:  BP 118/80   Pulse (!) 55   Temp (!) 97 F (36.1 C)   Ht 5\' 3"  (1.6 m)   Wt 181 lb (82.1 kg)   BMI 32.06 kg/m  self reported vitals Gen: NAD, resting comfortably Lungs: nonlabored, normal respiratory rate  Skin: appears dry, no obvious rash    Assessment and Plan   #  hypertension/CKD stage III S: controlled on  atenolol chlorthalidone 50-25mg  but only takes half tablet  CKD stage III has been stable with GFR in the high 40s or 50s.  She knows to avoid NSAIDs. Had been taking nsaids for arthritis in her back  BP Readings from Last 3 Encounters:  06/27/18 118/80  01/29/18 138/85  01/01/18 132/82  A/P: likely stable CKD- will try to update labs. Hypertension is controlled.   #hyperlipidemia S: poorly controlled on last check- now on atorvastatin 20 mg once a week. Also takes asa for  primary prevention. No issues with thes tatin.  A/P: hopefully stable- update an LDL level  # GERD S: Reasonably controlled on Pepcid 20 mg twice a day A/P: Stable. Continue current medications. Having to pay out of pocket and buy OTC   #Seasonal allergies S: Reasonably controlled on Flonase. Wonders if its due to her staying indoors more. Takes allegra daily A/P: doing well- continue prn flonase  And regular allegra  #Microscopic hematuria Recommended follow-up with urology after December visit with 7-10 red blood cells per high-power field.  We opted to do repeat urine microsopic which showed no blood thankfully and we opted out of urology follow up. Had previously seen urology in 2008 and seen Dr. Karsten Ro and had CT scan and cystoscopy. - thinks she may have been dehydrated  # Obesity S: patient has been walking 4 days a week. Has tried to eat better- down 6 lbs!!! Got into silver sneakers then covid 19 pandemic started.  Wt Readings from Last 3 Encounters:  06/27/18 181 lb (82.1 kg)  01/29/18 187 lb (84.8 kg)  01/09/18 187 lb (84.8 kg)  A/P: congratulated patient on weight loss- Encouraged need for healthy eating, regular exercise, weight loss.     Other notes: 1.  Thankful for normal bone density last visit-history of osteopenia with worst T score -1.2 in the past.  Would continue calcium and vitamin D to be on the safe side  2. Doing a good job with social distancing  6 month cpe advised Future Appointments  Date Time Provider [place]  01/07/2019  1:00 PM [facility]   Lab/Order associations: Hyperlipidemia, unspecified hyperlipidemia type - Plan: CBC with Differential/Platelet, Comprehensive metabolic panel, LDL cholesterol, direct  Essential hypertension - Plan: CBC with Differential/Platelet, Comprehensive metabolic panel  CKD (chronic kidney disease), stage III ([facility]) - Plan: CBC with Differential/Platelet, Comprehensive metabolic panel   Class 1 obesity due to excess calories with serious comorbidity in adult, unspecified BMI  Return precautions advised.  [name], MD

## 2018-06-30 ENCOUNTER — Other Ambulatory Visit: Payer: Self-pay

## 2018-06-30 ENCOUNTER — Other Ambulatory Visit (INDEPENDENT_AMBULATORY_CARE_PROVIDER_SITE_OTHER): Payer: Medicare Other

## 2018-06-30 DIAGNOSIS — E785 Hyperlipidemia, unspecified: Secondary | ICD-10-CM | POA: Diagnosis not present

## 2018-06-30 DIAGNOSIS — N183 Chronic kidney disease, stage 3 unspecified: Secondary | ICD-10-CM

## 2018-06-30 DIAGNOSIS — I10 Essential (primary) hypertension: Secondary | ICD-10-CM

## 2018-06-30 LAB — CBC WITH DIFFERENTIAL/PLATELET
Basophils Absolute: 0.1 10*3/uL (ref 0.0–0.1)
Basophils Relative: 2 % (ref 0.0–3.0)
Eosinophils Absolute: 0.2 10*3/uL (ref 0.0–0.7)
Eosinophils Relative: 4.2 % (ref 0.0–5.0)
HCT: 44.1 % (ref 36.0–46.0)
Hemoglobin: 14.7 g/dL (ref 12.0–15.0)
Lymphocytes Relative: 26 % (ref 12.0–46.0)
Lymphs Abs: 1.4 10*3/uL (ref 0.7–4.0)
MCHC: 33.4 g/dL (ref 30.0–36.0)
MCV: 89.7 fl (ref 78.0–100.0)
Monocytes Absolute: 0.5 10*3/uL (ref 0.1–1.0)
Monocytes Relative: 9.4 % (ref 3.0–12.0)
Neutro Abs: 3.2 10*3/uL (ref 1.4–7.7)
Neutrophils Relative %: 58.4 % (ref 43.0–77.0)
Platelets: 326 10*3/uL (ref 150.0–400.0)
RBC: 4.92 Mil/uL (ref 3.87–5.11)
RDW: 12.6 % (ref 11.5–15.5)
WBC: 5.5 10*3/uL (ref 4.0–10.5)

## 2018-06-30 LAB — COMPREHENSIVE METABOLIC PANEL
ALT: 13 U/L (ref 0–35)
AST: 16 U/L (ref 0–37)
Albumin: 4 g/dL (ref 3.5–5.2)
Alkaline Phosphatase: 61 U/L (ref 39–117)
BUN: 24 mg/dL — ABNORMAL HIGH (ref 6–23)
CO2: 29 mEq/L (ref 19–32)
Calcium: 9.8 mg/dL (ref 8.4–10.5)
Chloride: 104 mEq/L (ref 96–112)
Creatinine, Ser: 1.21 mg/dL — ABNORMAL HIGH (ref 0.40–1.20)
GFR: 43.81 mL/min — ABNORMAL LOW (ref >60.00)
Glucose, Bld: 82 mg/dL (ref 70–99)
Potassium: 4.4 mEq/L (ref 3.5–5.1)
Sodium: 142 mEq/L (ref 135–145)
Total Bilirubin: 0.6 mg/dL (ref 0.2–1.2)
Total Protein: 6.9 g/dL (ref 6.0–8.3)

## 2018-06-30 LAB — LDL CHOLESTEROL, DIRECT: Direct LDL: 95 mg/dL

## 2018-07-02 ENCOUNTER — Other Ambulatory Visit: Payer: Self-pay | Admitting: Family Medicine

## 2018-07-27 ENCOUNTER — Other Ambulatory Visit: Payer: Self-pay | Admitting: Family Medicine

## 2018-10-15 DIAGNOSIS — Z1231 Encounter for screening mammogram for malignant neoplasm of breast: Secondary | ICD-10-CM | POA: Diagnosis not present

## 2018-10-15 LAB — HM MAMMOGRAPHY

## 2018-10-24 ENCOUNTER — Encounter: Payer: Self-pay | Admitting: Family Medicine

## 2018-10-27 ENCOUNTER — Encounter: Payer: Self-pay | Admitting: Family Medicine

## 2018-10-27 DIAGNOSIS — N6001 Solitary cyst of right breast: Secondary | ICD-10-CM | POA: Diagnosis not present

## 2018-10-27 DIAGNOSIS — N6312 Unspecified lump in the right breast, upper inner quadrant: Secondary | ICD-10-CM | POA: Diagnosis not present

## 2018-11-11 ENCOUNTER — Encounter: Payer: Self-pay | Admitting: Family Medicine

## 2019-01-01 ENCOUNTER — Other Ambulatory Visit: Payer: Self-pay

## 2019-01-02 ENCOUNTER — Ambulatory Visit (INDEPENDENT_AMBULATORY_CARE_PROVIDER_SITE_OTHER): Payer: Medicare Other | Admitting: Family Medicine

## 2019-01-02 ENCOUNTER — Encounter: Payer: Self-pay | Admitting: Family Medicine

## 2019-01-02 VITALS — BP 142/90 | HR 61 | Temp 98.1°F | Ht 63.0 in | Wt 182.8 lb

## 2019-01-02 DIAGNOSIS — N183 Chronic kidney disease, stage 3 unspecified: Secondary | ICD-10-CM

## 2019-01-02 DIAGNOSIS — I10 Essential (primary) hypertension: Secondary | ICD-10-CM

## 2019-01-02 DIAGNOSIS — M8588 Other specified disorders of bone density and structure, other site: Secondary | ICD-10-CM

## 2019-01-02 DIAGNOSIS — R319 Hematuria, unspecified: Secondary | ICD-10-CM

## 2019-01-02 DIAGNOSIS — Z0000 Encounter for general adult medical examination without abnormal findings: Secondary | ICD-10-CM

## 2019-01-02 DIAGNOSIS — K219 Gastro-esophageal reflux disease without esophagitis: Secondary | ICD-10-CM

## 2019-01-02 DIAGNOSIS — E785 Hyperlipidemia, unspecified: Secondary | ICD-10-CM | POA: Diagnosis not present

## 2019-01-02 DIAGNOSIS — M1712 Unilateral primary osteoarthritis, left knee: Secondary | ICD-10-CM

## 2019-01-02 LAB — CBC WITH DIFFERENTIAL/PLATELET
Basophils Absolute: 0.1 10*3/uL (ref 0.0–0.1)
Basophils Relative: 1.2 % (ref 0.0–3.0)
Eosinophils Absolute: 0.1 10*3/uL (ref 0.0–0.7)
Eosinophils Relative: 2.1 % (ref 0.0–5.0)
HCT: 44.7 % (ref 36.0–46.0)
Hemoglobin: 15 g/dL (ref 12.0–15.0)
Lymphocytes Relative: 22.6 % (ref 12.0–46.0)
Lymphs Abs: 1.5 10*3/uL (ref 0.7–4.0)
MCHC: 33.5 g/dL (ref 30.0–36.0)
MCV: 91 fl (ref 78.0–100.0)
Monocytes Absolute: 0.6 10*3/uL (ref 0.1–1.0)
Monocytes Relative: 9.3 % (ref 3.0–12.0)
Neutro Abs: 4.3 10*3/uL (ref 1.4–7.7)
Neutrophils Relative %: 64.8 % (ref 43.0–77.0)
Platelets: 349 10*3/uL (ref 150.0–400.0)
RBC: 4.92 Mil/uL (ref 3.87–5.11)
RDW: 12.6 % (ref 11.5–15.5)
WBC: 6.7 10*3/uL (ref 4.0–10.5)

## 2019-01-02 LAB — COMPREHENSIVE METABOLIC PANEL
ALT: 18 U/L (ref 0–35)
AST: 21 U/L (ref 0–37)
Albumin: 4.3 g/dL (ref 3.5–5.2)
Alkaline Phosphatase: 62 U/L (ref 39–117)
BUN: 22 mg/dL (ref 6–23)
CO2: 29 mEq/L (ref 19–32)
Calcium: 10 mg/dL (ref 8.4–10.5)
Chloride: 103 mEq/L (ref 96–112)
Creatinine, Ser: 1.13 mg/dL (ref 0.40–1.20)
GFR: 47.34 mL/min — ABNORMAL LOW (ref >60.00)
Glucose, Bld: 96 mg/dL (ref 70–99)
Potassium: 4.1 mEq/L (ref 3.5–5.1)
Sodium: 141 mEq/L (ref 135–145)
Total Bilirubin: 0.6 mg/dL (ref 0.2–1.2)
Total Protein: 7.7 g/dL (ref 6.0–8.3)

## 2019-01-02 LAB — LIPID PANEL
Cholesterol: 192 mg/dL (ref 0–200)
HDL: 48.4 mg/dL (ref >39.00)
LDL Cholesterol: 122 mg/dL — ABNORMAL HIGH (ref 0–99)
NonHDL: 143.67
Total CHOL/HDL Ratio: 4
Triglycerides: 107 mg/dL (ref 0.0–149.0)
VLDL: 21.4 mg/dL (ref 0.0–40.0)

## 2019-01-02 LAB — URINALYSIS, MICROSCOPIC ONLY

## 2019-01-02 MED ORDER — ATENOLOL-CHLORTHALIDONE 50-25 MG PO TABS: ORAL_TABLET | ORAL | 1 refills | Status: DC

## 2019-01-02 NOTE — Assessment & Plan Note (Signed)
Osteopenia of spine-with most recent measure patient actually was in normal range.  Prior in 2016 had a worse T score of -1.2. .  I would likely repeat in 5 years obviously

## 2019-01-02 NOTE — Patient Instructions (Addendum)
Health Maintenance Due  Topic Date Due  . INFLUENZA VACCINE already had 11/21/2018 08/23/2018   Try full tablet of atenolol hctz and follow up with me for a video visit in 10-14 days. Monitor blood pressure daily and pulse and write all the #s down for me. Sometimes people feel slightly tired on more atenolol so watch for that.   Please stop by lab before you go If you do not have mychart- we will call you about results within 5 business days of [place] receiving them.  If you have mychart- we will send your results within 3 business days of [place] receiving them.  If abnormal or we want to clarify a result, we will call or mychart you to make sure you receive the message.  If you have questions or concerns or don't hear within 5-7 days, please send [place] a message or call us.    Recommended follow [id number] in about 6 months (around 07/03/2019).

## 2019-01-02 NOTE — Progress Notes (Signed)
Phone: [phone]   Subjective:  Patient presents today for their annual physical. Chief complaint-noted.   See problem oriented charting- Review of Systems  Constitutional: Negative.   HENT: Negative.   Eyes: Negative.   Respiratory: Negative.   Cardiovascular: Negative.   Gastrointestinal: Negative.   Genitourinary: Negative.   Musculoskeletal: Positive for back pain and joint pain (arthritis of the knees).  Skin: Negative.   Neurological: Negative.   Endo/Heme/Allergies: Bruises/bleeds easily (81 mg Aspirin).  Psychiatric/Behavioral: Negative.    The following were reviewed and entered/updated in epic: Past Medical History:  Diagnosis Date  . Allergy   . Arthritis   . Carpal tunnel syndrome of left wrist   . Carpal tunnel syndrome of right wrist   . Chronic left shoulder pain 05/31/2011   Frozen shoulder history- did exercises and eventually resolved  . Hypertension   . Lichen planus [id number]   Qualifier: Diagnosis of  By: Sherren Mocha MD, Jory Ee    Patient Active Problem List   Diagnosis Date Noted  . CKD (chronic kidney disease), stage III 12/25/2017    Priority: Medium  . Hyperlipidemia 04/04/2016    Priority: Medium  . GERD (gastroesophageal reflux disease) 05/09/2015    Priority: Medium  . Osteopenia 03/15/2014    Priority: Medium  . Vaginitis, atrophic 02/24/2014    Priority: Medium  . Hematuria 12/17/2006    Priority: Medium  . Essential hypertension 09/06/2006    Priority: Medium  . Carpal tunnel syndrome, left upper limb 10/16/2017    Priority: Low  . Osteoarthritis of left knee 02/20/2016    Priority: Low  . Obesity 05/31/2011    Priority: Low  . Allergic rhinitis 09/06/2006    Priority: Low   Past Surgical History:  Procedure Laterality Date  . CARPAL TUNNEL RELEASE     rt  . CATARACT EXTRACTION W/ INTRAOCULAR LENS  IMPLANT, BILATERAL    . PARATHYROIDECTOMY  2004   1 gland removed  . PLANTAR FASCIA SURGERY  2005   left  . TONSILLECTOMY     . VAGINAL HYSTERECTOMY      Family History  Problem Relation Age of Onset  . Alzheimer's disease Mother   . Leukemia Father   . CVA Father   . Hypertension Father   . Hypertension Sister   . Plantar fasciitis Daughter   . Diverticulitis Son   . Lymphoma Maternal Grandmother   . Early death Maternal Grandfather   . Stroke Paternal Grandmother   . Dementia Paternal Grandmother   . Stroke Paternal Grandfather   . Hypertension Paternal Grandfather   . Colon cancer Neg Hx     Medications- reviewed and updated Current Outpatient Medications  Medication Sig Dispense Refill  . aspirin 81 MG tablet Take 81 mg by mouth daily.    Marland Kitchen atenolol-chlorthalidone (TENORETIC) 50-25 MG tablet Take 1 full tablet as long as pulse remains above 50 100 tablet 1  . atorvastatin (LIPITOR) 20 MG tablet TAKE 1 TABLET(20 MG) BY MOUTH 1 TIME A WEEK 13 tablet 3  . calcium citrate-vitamin D (CITRACAL+D) 315-200 MG-UNIT per tablet Take 1 tablet by mouth daily.    . famotidine (PEPCID) 20 MG tablet Take 1 tablet (20 mg total) by mouth 2 (two) times daily. 180 tablet 3  . fexofenadine (ALLEGRA) 180 MG tablet Take 180 mg by mouth daily.    . fluticasone (FLONASE) 50 MCG/ACT nasal spray PLACE 2 SPRAYS IN EACH NOSTRIL DAILY 32 g 5   No current facility-administered medications for this visit.  Allergies-reviewed and updated Allergies  Allergen Reactions  . Penicillins Rash    REACTION: Rash    Social History   Social History Narrative   Married. Husband Gwyndolyn Saxon. 2 kids by first marriage. 2 step kids. 7 grandkids.       Retired Loss adjuster, [employer]   Objective  Objective:  BP (!) 142/90 (BP Location: Right Arm, Patient Position: Sitting, Cuff Size: Normal)   Pulse 61   Temp 98.1 F (36.7 C) (Temporal)   Ht 5\' 3"  (1.6 m)   Wt 182 lb 12.8 oz (82.9 kg)   SpO2 98%   BMI 32.38 kg/m  Gen: NAD, resting comfortably HEENT: Mask not removed due to covid 19. TM normal. Bridge of nose  normal. Eyelids normal.  Neck: no thyromegaly or cervical lymphadenopathy  CV: RRR no murmurs rubs or gallops Lungs: CTAB no crackles, wheeze, rhonchi Abdomen: soft/nontender/nondistended/normal bowel sounds. No rebound or guarding.  Ext: no edema Skin: warm, dry Neuro: grossly normal, moves all extremities, PERRLA   Assessment and Plan   73 y.o. female presenting for annual physical.  Health Maintenance counseling: 1. Anticipatory guidance: Patient counseled regarding regular dental exams q6 months, eye exams yearly,  avoiding smoking and second hand smoke , limiting alcohol to 1 or less beverage per day .  Patient does not drink 2. Risk factor reduction:  Advised patient of need for regular exercise and diet rich and fruits and vegetables to reduce risk of heart attack and stroke. Exercise- walks 1-2 miles a day. Diet- eats fruits and vegetables and smaller portions of meat. More of Chicken and fish.  She has lost 5 pounds from January!  Congratulated her on progress  Wt Readings from Last 3 Encounters:  01/02/19 182 lb 12.8 oz (82.9 kg)  06/27/18 181 lb (82.1 kg)  01/29/18 187 lb (84.8 kg)   3. Immunizations/screenings/ancillary studies-had flu shot on October 30.  Staff will enter this.. Discussed that since she does not have major medical illnesses-we can defer Prevnar 13 indefinitely with newer guidelines we could do pneumovax 23 next year- she wants to push out due to covid 19.  Discussed Shingrix at pharmacy- waiting until after covid 19.   Immunization History  Administered Date(s) Administered  . Fluad Quad(high Dose 65+) 11/21/2018  . Influenza Split 10/22/2012  . Influenza Whole 01/22/2005, 10/23/2006, 10/22/2008, 10/22/2009  . Influenza-Unspecified 10/22/2013, 11/09/2015, 10/31/2017  . Pneumococcal Polysaccharide-23 12/02/2007  . Td 01/22/2001  . Tetanus 02/17/2013  4. Cervical cancer screening-past age to based screening recommendations- never had abnormal pap smear.  Hysterectomy at 11.   5. Breast cancer screening-  breast exam monthly and mammogram completed October 15, 2018- had follow up ultrasound  6. Colon cancer screening - due on 03/05/2023.  Reports normal in February 2015 7. Skin cancer screening- has dermatologist in [place]- usually sees yearly but no issues- pushing out with covid 19 advised regular sunscreen use. Denies worrisome, changing, or new skin lesions.  8. Birth control/STD check-postmenopausal and monogamous  9. Osteoporosis screening at 33- had completed on 04/07/2018.Osteopenia of spine-with most recent measure patient actually was in normal range.  Prior in 2016 had a worse T score of -1.2.  We discussed continuing calcium and vitamin D as this seems to have been helpful.  I would likely repeat in 5 years obviously -Never smoker  Status of chronic or acute concerns   Hyperlipidemia-patient compliant with atorvastatin once a week.  Update lipid panel today.  Patient also takes aspirin for primary  prevention  Hematuria-prior work-up by urology.  They recommended only referring back if had worsening microscopic hematuria.  Update urine microscopic today.  Gastroesophageal reflux disease without esophagitis- able to get by with 1 pepcid a day  Essential hypertension-patient is compliant with half a tablet of atenolol chlorthalidone 50-25 mg.Initial blood pressure was elevated but on repeat   Stage 3 chronic kidney disease, unspecified whether stage 3a or 3b CKD-patient knows to avoid NSAIDs.  We will continue to work on blood pressure control   Primary osteoarthritis of left knee. He did her carpal tunnel surgery as well.  -Follows with Dr. Durward Fortes- no issues recently.   Recommended follow up: Return in about 10 days (around 01/12/2019) for video visit to check on blood pressure.  Lab/Order associations: fasting   ICD-10-CM   1. Preventative health care  Z00.00   2. Osteopenia of spine  M85.88   3. Hyperlipidemia,  unspecified hyperlipidemia type  E78.5 CBC with Differential/Platelet    Comprehensive metabolic panel    Lipid panel  4. Hematuria, unspecified type  R31.9 Urine Microscopic  5. Gastroesophageal reflux disease without esophagitis  K21.9   6. Essential hypertension  I10   7. Stage 3 chronic kidney disease, unspecified whether stage 3a or 3b CKD  N18.30   8. Primary osteoarthritis of left knee  M17.12     Meds ordered this encounter  Medications  . atenolol-chlorthalidone (TENORETIC) 50-25 MG tablet    Sig: Take 1 full tablet as long as pulse remains above 50    Dispense:  100 tablet    Refill:  1    Return precautions advised.  [name], MD

## 2019-01-02 NOTE — Progress Notes (Signed)
Slight increase in the amount of blood seen in the urine.  Team please repeat urine microscopic in 2 to 3 months under microscopic hematuria-if worsens may refer back to urology  Kidney function is stable to slightly improved-still in chronic kidney disease stage III range but improved.  Electrolytes are normal.  Liver is normal.  CBC/blood counts were normal  Your cholesterol continues to be elevated but has substantially improved from last year.  Total cholesterol down to 192 from 242.  Triglycerides down to 107 from 167.  Bad cholesterol down to 122 from 192.  This is a significant improvement-lets work on Oceanographer exercise instead of increasing medicine at this time.  We also need to make sure the blood pressure comes down as we discussed.

## 2019-01-05 ENCOUNTER — Other Ambulatory Visit: Payer: Self-pay

## 2019-01-05 DIAGNOSIS — R319 Hematuria, unspecified: Secondary | ICD-10-CM

## 2019-01-07 ENCOUNTER — Ambulatory Visit: Payer: Medicare Other

## 2019-01-14 ENCOUNTER — Encounter: Payer: Self-pay | Admitting: Family Medicine

## 2019-01-14 ENCOUNTER — Ambulatory Visit (INDEPENDENT_AMBULATORY_CARE_PROVIDER_SITE_OTHER): Payer: Medicare Other | Admitting: Family Medicine

## 2019-01-14 VITALS — BP 113/81 | HR 69

## 2019-01-14 DIAGNOSIS — I10 Essential (primary) hypertension: Secondary | ICD-10-CM | POA: Diagnosis not present

## 2019-01-14 DIAGNOSIS — Z0000 Encounter for general adult medical examination without abnormal findings: Secondary | ICD-10-CM | POA: Diagnosis not present

## 2019-01-14 MED ORDER — ATENOLOL-CHLORTHALIDONE 50-25 MG PO TABS: ORAL_TABLET | ORAL | 3 refills | Status: DC

## 2019-01-14 NOTE — Progress Notes (Signed)
Phone [phone] Virtual visit via Video note    Subjective:  Chief complaint: Patient presents today for their annual wellness visit.    This visit type was conducted due to national recommendations for restrictions regarding the COVID-19 Pandemic (e.g. social distancing).  This format is felt to be most appropriate for this patient at this time balancing risks to patient and risks to population by having him in for in person visit.  No physical exam was performed (except for noted visual exam or audio findings with Telehealth visits).    Our team/I connected with Noah Delaine at 10:00 AM EST by a video enabled telemedicine application ([email or web address] or caregility through epic) and verified that I am speaking with the correct person using two identifiers.  Location patient: [facility] Location provider: [facility], office Persons participating in the virtual visit:  patient  Our team/I discussed the limitations of evaluation and management by telemedicine and the availability of in person appointments. In light of current covid-19 pandemic, patient also understands that we are trying to protect them by minimizing in office contact if at all possible.  The patient expressed consent for telemedicine visit and agreed to proceed. Patient understands insurance will be billed.   ROS pertinent- No chest pain or shortness of breath. No headache or blurry vision.     Preventive Screening-Counseling & Management  Smoking Status: Never Smoker Second Hand Smoking status: No smokers in home Alcohol intake: never drinker  Risk Factors Regular exercise: walking 1-2 miles about 5 days a week Diet: had lost 5 lbs from January which is incredible in light of covid 19- tryign to eat smaller portions and healtheir foods.   Wt Readings from Last 3 Encounters:  01/02/19 182 lb 12.8 oz (82.9 kg)  06/27/18 181 lb (82.1 kg)  01/29/18 187 lb (84.8 kg)   Fall Risk: None  Fall Risk  01/14/2019 01/02/2019 06/27/2018  12/31/2017 12/25/2017  Falls in the past year? 0 0 0 0 0  Comment - - - - -  Number falls in past yr: 0 0 0 - -  Comment - - - - -  Injury with Fall? - 0 0 - -  Opioid use history:  no long term opioids use  Cardiac risk factors:  advanced age (older than 53 for men, 69 for women)  Treated Hyperlipidemia with statin- working on lifestyle to further lower #s Treated Hypertension  No diabetes.  Family History: No CAD/stroke history  Depression Screen None. PHQ2 0. No history of depression Depression screen [facility] 2/9 01/14/2019 01/14/2019 01/02/2019 12/31/2017 12/25/2017  Decreased Interest 0 0 0 0 0  Down, Depressed, Hopeless 0 0 0 0 0  PHQ - 2 Score 0 0 0 0 0  Altered sleeping - 0 0 - -  Tired, decreased energy - 0 0 - -  Change in appetite - 0 0 - -  Feeling bad or failure about yourself  - 0 0 - -  Trouble concentrating - 0 0 - -  Moving slowly or fidgety/restless - 0 0 - -  Suicidal thoughts - 0 0 - -  PHQ-9 Score - 0 0 - -  Difficult doing work/chores - Not difficult at all Not difficult at all - -    Activities of Daily Living Independent ADLs (toileting, bathing, dressing, transferring, eating) and IADLs (shopping, housekeeping, managing own medications, and handling finances  Hearing Difficulties: -patient notes very mild decrease. States not bad enough to get checked- she will reconsider when things are  better with covid perhaps 6 months  Cognitive Testing             No reported trouble.   3/3 delayed recall with distraction  List the Names of Other Physician/Practitioners you currently use: Patient Care Team: [name], MD as PCP - General (Family Medicine) Garrel Ridgel, DPM as Consulting Physician (Podiatry) Garald Balding, MD as Consulting Physician (Orthopedic Surgery) [name] Kirke Corin, MD as Consulting Physician (Gastroenterology)  Immunization History  Administered Date(s) Administered  . Fluad Quad(high Dose 65+) 11/21/2018  . Influenza  Split 10/22/2012  . Influenza Whole 01/22/2005, 10/23/2006, 10/22/2008, 10/22/2009  . Influenza-Unspecified 10/22/2013, 11/09/2015, 10/31/2017  . Pneumococcal Polysaccharide-23 12/02/2007  . Td 01/22/2001  . Tetanus 02/17/2013   Required Immunizations needed today - due for pneumovax 23 and shingrix but wants things to calm down with covid first. She is interested in covid 19 vaccine.  Health Maintenance  Topic Date Due  . Mammogram  10/14/2020  . Tetanus Vaccine  02/18/2023  . Colon Cancer Screening  03/05/2023  . Flu Shot  Completed  . DEXA scan (bone density measurement)  Completed  .  Hepatitis C: One time screening is recommended by Center for Disease Control  (CDC) for  adults born from [birth year] through 1964.   Completed  . Pneumonia vaccines  Discontinued   Screening tests-  1. Colon cancer screening-  She will be due 03/05/2023. Had in 2015.  2. Lung Cancer screening- not a candidate 3. Skin cancer screening- Dr. Nevada Crane in [place] 4. Cervical cancer screening-  Never had abnormal pap smear and past age based screening. Hysterectomy at age 72 due to precancerous cells.  5. Breast cancer screening- September 2020 and had follow up ultrasound  ROS- No pertinent positives discovered in course of AWV ROS- No chest pain or shortness of breath. No headache or blurry vision.   The following were reviewed and entered/updated in epic: Past Medical History:  Diagnosis Date  . Allergy   . Arthritis   . Carpal tunnel syndrome of left wrist   . Carpal tunnel syndrome of right wrist   . Chronic left shoulder pain 05/31/2011   Frozen shoulder history- did exercises and eventually resolved  . Hypertension   . Lichen planus [id number]   Qualifier: Diagnosis of  By: Sherren Mocha MD, Jory Ee    Patient Active Problem List   Diagnosis Date Noted  . CKD (chronic kidney disease), stage III 12/25/2017    Priority: Medium  . Hyperlipidemia 04/04/2016    Priority: Medium  . GERD (gastroesophageal  reflux disease) 05/09/2015    Priority: Medium  . Osteopenia 03/15/2014    Priority: Medium  . Vaginitis, atrophic 02/24/2014    Priority: Medium  . Hematuria 12/17/2006    Priority: Medium  . Essential hypertension 09/06/2006    Priority: Medium  . Carpal tunnel syndrome, left upper limb 10/16/2017    Priority: Low  . Osteoarthritis of left knee 02/20/2016    Priority: Low  . Obesity 05/31/2011    Priority: Low  . Allergic rhinitis 09/06/2006    Priority: Low   Past Surgical History:  Procedure Laterality Date  . CARPAL TUNNEL RELEASE     rt  . CATARACT EXTRACTION W/ INTRAOCULAR LENS  IMPLANT, BILATERAL    . PARATHYROIDECTOMY  2004   1 gland removed  . PLANTAR FASCIA SURGERY  2005   left  . TONSILLECTOMY    . VAGINAL HYSTERECTOMY      Family History  Problem Relation Age of Onset  . Alzheimer's disease Mother   . Leukemia Father   . CVA Father   . Hypertension Father   . Hypertension Sister   . Plantar fasciitis Daughter   . Diverticulitis Son   . Lymphoma Maternal Grandmother   . Early death Maternal Grandfather   . Stroke Paternal Grandmother   . Dementia Paternal Grandmother   . Stroke Paternal Grandfather   . Hypertension Paternal Grandfather   . Colon cancer Neg Hx     Medications- reviewed and updated Current Outpatient Medications  Medication Sig Dispense Refill  . aspirin 81 MG tablet Take 81 mg by mouth daily.    Marland Kitchen atenolol-chlorthalidone (TENORETIC) 50-25 MG tablet Take 1 full tablet as long as pulse remains above 50 100 tablet 1  . atorvastatin (LIPITOR) 20 MG tablet TAKE 1 TABLET(20 MG) BY MOUTH 1 TIME A WEEK 13 tablet 3  . calcium citrate-vitamin D (CITRACAL+D) 315-200 MG-UNIT per tablet Take 1 tablet by mouth daily.    . famotidine (PEPCID) 20 MG tablet Take 1 tablet (20 mg total) by mouth 2 (two) times daily. 180 tablet 3  . fexofenadine (ALLEGRA) 180 MG tablet Take 180 mg by mouth daily.    . fluticasone (FLONASE) 50 MCG/ACT nasal spray  PLACE 2 SPRAYS IN EACH NOSTRIL DAILY 32 g 5   No current facility-administered medications for this visit.    Allergies-reviewed and updated Allergies  Allergen Reactions  . Penicillins Rash    REACTION: Rash    Social History   Socioeconomic History  . Marital status: Married    Spouse name: Not on file  . Number of children: Not on file  . Years of education: Not on file  . Highest education level: Not on file  Occupational History  . Not on file  Tobacco Use  . Smoking status: Never Smoker  . Smokeless tobacco: Never Used  Substance and Sexual Activity  . Alcohol use: No  . Drug use: No  . Sexual activity: Yes  Other Topics Concern  . Not on file  Social History Narrative   Married. Husband Gwyndolyn Saxon. 2 kids by first marriage. 2 step kids. 7 grandkids.       Retired Loss adjuster, [employer]   Social Determinants of Radio broadcast assistant Strain:   . Difficulty of Paying Living Expenses: Not on file  Food Insecurity:   . Worried About Charity fundraiser in the Last Year: Not on file  . Ran Out of Food in the Last Year: Not on file  Transportation Needs:   . Lack of Transportation (Medical): Not on file  . Lack of Transportation (Non-Medical): Not on file  Physical Activity:   . Days of Exercise per Week: Not on file  . Minutes of Exercise per Session: Not on file  Stress:   . Feeling of Stress : Not on file  Social Connections:   . Frequency of Communication with Friends and Family: Not on file  . Frequency of Social Gatherings with Friends and Family: Not on file  . Attends Religious Services: Not on file  . Active Member of Clubs or Organizations: Not on file  . Attends Archivist Meetings: Not on file  . Marital Status: Not on file      Objective:  BP 113/81   Pulse 69  No obvious distress on video visit Regular respiratory rate     Assessment/Plan:  AWV completed- discussed recommended screenings and documented  any  personalized health advice and referrals for preventive counseling. See AVS as well which was given to patient.   Status of chronic or acute concerns   Essential hypertension Patient has been monitoring her blood pressure at home.  Fortunately she did not have to increase her atenolol hydrochlorothiazide to a full tablet.  She has noted blood pressures are well controlled under 140/90.  Today her blood pressure looked excellent at 113/81.  We have opted to continue with 1/2 tablet since blood pressure has been so well controlled.  In the future if she has elevated readings we will try to do home monitoring before making any adjustments since she seems to have some elevations in the office-I apologized for increasing her anxiety when coming in~!   Recommended follow up:  Follow up in 6 months Future Appointments  Date Time Provider [place]  03/23/2019  2:30 PM [facility]  07/08/2019  9:40 AM Yong Channel, Brayton Mars, MD [facility]     Lab/Order associations:   ICD-10-CM   1. Preventative health care  Z00.00   2. Essential hypertension  I10    Return precautions advised. [name], MD

## 2019-01-14 NOTE — Patient Instructions (Addendum)
Will mail to patient   Marie Ramirez , Thank you for taking time to come for your Medicare Wellness Visit. I appreciate your ongoing commitment to your health goals. Please review the following plan we discussed and let me know if I can assist you in the future.   These are the goals we discussed: 1. Wants to lose another 5 lbs 2. Continue blood pressure control 138/88 or less   This is a list of the screening recommended for you and due dates:  Health Maintenance  Topic Date Due  . Mammogram  10/14/2020  . Tetanus Vaccine  02/18/2023  . Colon Cancer Screening  03/05/2023  . Flu Shot  Completed  . DEXA scan (bone density measurement)  Completed  .  Hepatitis C: One time screening is recommended by Center for Disease Control  (CDC) for  adults born from [birth year] through 1964.   Completed  . Pneumonia vaccines  Discontinued

## 2019-01-14 NOTE — Assessment & Plan Note (Signed)
Patient has been monitoring her blood pressure at home.  Fortunately she did not have to increase her atenolol hydrochlorothiazide to a full tablet.  She has noted blood pressures are well controlled under 140/90.  Today her blood pressure looked excellent at 113/81.  We have opted to continue with 1/2 tablet since blood pressure has been so well controlled.  In the future if she has elevated readings we will try to do home monitoring before making any adjustments since she seems to have some elevations in the office-I apologized for increasing her anxiety when coming in~!

## 2019-01-29 ENCOUNTER — Ambulatory Visit: Payer: Medicare Other

## 2019-02-05 ENCOUNTER — Encounter: Payer: Self-pay | Admitting: Family Medicine

## 2019-03-19 ENCOUNTER — Other Ambulatory Visit: Payer: Self-pay

## 2019-03-23 ENCOUNTER — Other Ambulatory Visit: Payer: Self-pay

## 2019-03-23 ENCOUNTER — Other Ambulatory Visit (INDEPENDENT_AMBULATORY_CARE_PROVIDER_SITE_OTHER): Payer: Medicare Other

## 2019-03-23 DIAGNOSIS — R319 Hematuria, unspecified: Secondary | ICD-10-CM | POA: Diagnosis not present

## 2019-03-23 LAB — URINALYSIS, ROUTINE W REFLEX MICROSCOPIC
Bilirubin Urine: NEGATIVE
Ketones, ur: NEGATIVE
Leukocytes,Ua: NEGATIVE
Nitrite: NEGATIVE
Specific Gravity, Urine: <=1.005 — AB (ref 1.000–1.030)
Total Protein, Urine: NEGATIVE
Urine Glucose: NEGATIVE
Urobilinogen, UA: 0.2 (ref 0.0–1.0)
WBC, UA: NONE SEEN (ref 0–?)
pH: 6 (ref 5.0–8.0)

## 2019-04-16 ENCOUNTER — Encounter: Payer: Self-pay | Admitting: Family Medicine

## 2019-07-08 ENCOUNTER — Other Ambulatory Visit: Payer: Self-pay

## 2019-07-08 ENCOUNTER — Ambulatory Visit (INDEPENDENT_AMBULATORY_CARE_PROVIDER_SITE_OTHER): Payer: Medicare Other | Admitting: Family Medicine

## 2019-07-08 ENCOUNTER — Encounter: Payer: Self-pay | Admitting: Family Medicine

## 2019-07-08 VITALS — BP 122/70 | HR 68 | Temp 98.5°F | Ht 63.0 in | Wt 184.2 lb

## 2019-07-08 DIAGNOSIS — K219 Gastro-esophageal reflux disease without esophagitis: Secondary | ICD-10-CM

## 2019-07-08 DIAGNOSIS — I10 Essential (primary) hypertension: Secondary | ICD-10-CM

## 2019-07-08 DIAGNOSIS — E785 Hyperlipidemia, unspecified: Secondary | ICD-10-CM | POA: Diagnosis not present

## 2019-07-08 DIAGNOSIS — N183 Chronic kidney disease, stage 3 unspecified: Secondary | ICD-10-CM

## 2019-07-08 LAB — BASIC METABOLIC PANEL
BUN: 19 mg/dL (ref 6–23)
CO2: 29 mEq/L (ref 19–32)
Calcium: 10.3 mg/dL (ref 8.4–10.5)
Chloride: 105 mEq/L (ref 96–112)
Creatinine, Ser: 1.17 mg/dL (ref 0.40–1.20)
GFR: 45.41 mL/min — ABNORMAL LOW (ref >60.00)
Glucose, Bld: 95 mg/dL (ref 70–99)
Potassium: 4.3 mEq/L (ref 3.5–5.1)
Sodium: 140 mEq/L (ref 135–145)

## 2019-07-08 NOTE — Patient Instructions (Addendum)
Please stop by lab before you go If you have mychart- we will send your results within 3 business days of [place] receiving them.  If you do not have mychart- we will call you about results within 5 business days of [place] receiving them.   Glad you are doing well! Great job on the exercise!

## 2019-07-08 NOTE — Progress Notes (Signed)
Phone [phone] In person visit   Subjective:   Marie Ramirez is a 74 y.o. year old very pleasant female patient who presents for/with See problem oriented charting Chief Complaint  Patient presents with  . Follow-up  . Hyperlipidemia  . Hypertension   This visit occurred during the SARS-CoV-2 public health emergency.  Safety protocols were in place, including screening questions prior to the visit, additional usage of staff PPE, and extensive cleaning of exam room while observing appropriate contact time as indicated for disinfecting solutions.   Past Medical History-  Patient Active Problem List   Diagnosis Date Noted  . CKD (chronic kidney disease), stage III 12/25/2017    Priority: Medium  . Hyperlipidemia 04/04/2016    Priority: Medium  . GERD (gastroesophageal reflux disease) 05/09/2015    Priority: Medium  . Osteopenia 03/15/2014    Priority: Medium  . Vaginitis, atrophic 02/24/2014    Priority: Medium  . Hematuria 12/17/2006    Priority: Medium  . Essential hypertension 09/06/2006    Priority: Medium  . Carpal tunnel syndrome, left upper limb 10/16/2017    Priority: Low  . Osteoarthritis of left knee 02/20/2016    Priority: Low  . Obesity 05/31/2011    Priority: Low  . Allergic rhinitis 09/06/2006    Priority: Low    Medications- reviewed and updated Current Outpatient Medications  Medication Sig Dispense Refill  . aspirin 81 MG tablet Take 81 mg by mouth daily.    Marland Kitchen atenolol-chlorthalidone (TENORETIC) 50-25 MG tablet Take half tablet daily 46 tablet 3  . atorvastatin (LIPITOR) 20 MG tablet TAKE 1 TABLET(20 MG) BY MOUTH 1 TIME A WEEK 13 tablet 3  . calcium citrate-vitamin D (CITRACAL+D) 315-200 MG-UNIT per tablet Take 1 tablet by mouth daily.    . famotidine (PEPCID) 20 MG tablet Take 1 tablet (20 mg total) by mouth 2 (two) times daily. 180 tablet 3  . fexofenadine (ALLEGRA) 180 MG tablet Take 180 mg by mouth daily.    . fluticasone (FLONASE) 50 MCG/ACT  nasal spray PLACE 2 SPRAYS IN EACH NOSTRIL DAILY 32 g 5   No current facility-administered medications for this visit.     Objective:  BP 122/70   Pulse 68   Temp 98.5 F (36.9 C)   Ht 5\' 3"  (1.6 m)   Wt 184 lb 3.2 oz (83.6 kg)   SpO2 98%   BMI 32.63 kg/m  Gen: NAD, resting comfortably CV: RRR no murmurs rubs or gallops Lungs: CTAB no crackles, wheeze, rhonchi Abdomen: soft/nontender/nondistended/normal bowel sounds. No rebound or guarding.  Ext: trace edema Skin: warm, dry    Assessment and Plan   #hypertension S: medication: atenolol  Chlorthalidone 50-25mg - half tablet only Still walking 30 mins a day 6 days a week and that really helps. Only up 2 lbs from last visit BP Readings from Last 3 Encounters:  07/08/19 122/70  01/14/19 113/81  01/02/19 (!) 142/90  A/P: Excellent control with walking Added in-continue current medication  #hyperlipidemia S: Medication:atorvastatin 20 mg once a week  Lab Results  Component Value Date   CHOL 192 01/02/2019   HDL 48.40 01/02/2019   LDLCALC 122 (H) 01/02/2019   LDLDIRECT 95.0 06/30/2018   TRIG 107.0 01/02/2019   CHOLHDL 4 01/02/2019   A/P: hopefully controlled-he is going to continue to work on healthy eating/regular exercise and we will recheck in December  #Chronic kidney disease stage III S: GFR is typically in the 40s range -Patient knows to avoid NSAIDs . Tylenol  is fine.  A/P: hopefully stable- recheck CMP today   Recommended follow up: Return in about 6 months (around 01/07/2020) for physical or sooner if needed.  Lab/Order associations:   ICD-10-CM   1. Hyperlipidemia, unspecified hyperlipidemia type  E78.5   2. Essential hypertension  I10   3. Stage 3 chronic kidney disease, unspecified whether stage 3a or 3b CKD  E43.53 Basic metabolic panel  4. Gastroesophageal reflux disease without esophagitis  K21.9    Return precautions advised.  [name], MD

## 2019-09-20 ENCOUNTER — Other Ambulatory Visit: Payer: Self-pay | Admitting: Family Medicine

## 2019-10-08 ENCOUNTER — Other Ambulatory Visit: Payer: Self-pay

## 2019-10-08 ENCOUNTER — Other Ambulatory Visit: Payer: Medicare Other

## 2019-10-08 DIAGNOSIS — Z20822 Contact with and (suspected) exposure to covid-19: Secondary | ICD-10-CM | POA: Diagnosis not present

## 2019-10-10 LAB — NOVEL CORONAVIRUS, NAA: SARS-CoV-2, NAA: NOT DETECTED

## 2019-10-10 LAB — SARS-COV-2, NAA 2 DAY TAT

## 2019-10-21 ENCOUNTER — Encounter: Payer: Self-pay | Admitting: Family Medicine

## 2019-10-21 DIAGNOSIS — Z1231 Encounter for screening mammogram for malignant neoplasm of breast: Secondary | ICD-10-CM | POA: Diagnosis not present

## 2019-10-27 ENCOUNTER — Encounter: Payer: Self-pay | Admitting: Family Medicine

## 2019-10-27 DIAGNOSIS — N6001 Solitary cyst of right breast: Secondary | ICD-10-CM | POA: Diagnosis not present

## 2019-10-27 DIAGNOSIS — N6311 Unspecified lump in the right breast, upper outer quadrant: Secondary | ICD-10-CM | POA: Diagnosis not present

## 2020-01-11 NOTE — Progress Notes (Signed)
Phone [phone]   Subjective:  Patient presents today for their annual physical. Chief complaint-noted.   See problem oriented charting- ROS- full  review of systems was completed and negative except for: joint pain, back pain unchnaged, seasonal allergies  The following were reviewed and entered/updated in epic: Past Medical History:  Diagnosis Date  . Allergy   . Arthritis   . Carpal tunnel syndrome of left wrist   . Carpal tunnel syndrome of right wrist   . Chronic left shoulder pain 05/31/2011   Frozen shoulder history- did exercises and eventually resolved  . Hypertension   . Lichen planus 73/71/0626   Qualifier: Diagnosis of  By: Sherren Mocha MD, Jory Ee    Patient Active Problem List   Diagnosis Date Noted  . CKD (chronic kidney disease), stage III ([place]) 12/25/2017    Priority: Medium  . Hyperlipidemia 04/04/2016    Priority: Medium  . GERD (gastroesophageal reflux disease) 05/09/2015    Priority: Medium  . Vaginitis, atrophic 02/24/2014    Priority: Medium  . Hematuria 12/17/2006    Priority: Medium  . Essential hypertension 09/06/2006    Priority: Medium  . Carpal tunnel syndrome, left upper limb 10/16/2017    Priority: Low  . Osteoarthritis of left knee 02/20/2016    Priority: Low  . Osteopenia 03/15/2014    Priority: Low  . Obesity 05/31/2011    Priority: Low  . Allergic rhinitis 09/06/2006    Priority: Low   Past Surgical History:  Procedure Laterality Date  . CARPAL TUNNEL RELEASE     rt  . CATARACT EXTRACTION W/ INTRAOCULAR LENS  IMPLANT, BILATERAL    . PARATHYROIDECTOMY  2004   1 gland removed  . PLANTAR FASCIA SURGERY  2005   left  . TONSILLECTOMY    . VAGINAL HYSTERECTOMY      Family History  Problem Relation Age of Onset  . Alzheimer's disease Mother   . Leukemia Father   . CVA Father   . Hypertension Father   . Hypertension Sister   . Plantar fasciitis Daughter   . Diverticulitis Son   . Lymphoma Maternal Grandmother   . Early  death Maternal Grandfather   . Stroke Paternal Grandmother   . Dementia Paternal Grandmother   . Stroke Paternal Grandfather   . Hypertension Paternal Grandfather   . Colon cancer Neg Hx     Medications- reviewed and updated Current Outpatient Medications  Medication Sig Dispense Refill  . aspirin 81 MG tablet Take 81 mg by mouth daily.    Marland Kitchen atenolol-chlorthalidone (TENORETIC) 50-25 MG tablet Take half tablet daily 46 tablet 3  . atorvastatin (LIPITOR) 20 MG tablet TAKE 1 TABLET(20 MG) BY MOUTH 1 TIME A WEEK 13 tablet 3  . calcium citrate-vitamin D (CITRACAL+D) 315-200 MG-UNIT per tablet Take 1 tablet by mouth daily.    . famotidine (PEPCID) 20 MG tablet Take 1 tablet (20 mg total) by mouth 2 (two) times daily. (Patient taking differently: Take 20 mg by mouth daily.) 180 tablet 3  . fexofenadine (ALLEGRA) 180 MG tablet Take 180 mg by mouth daily.    . fluticasone (FLONASE) 50 MCG/ACT nasal spray PLACE 2 SPRAYS IN EACH NOSTRIL DAILY 32 g 5   No current facility-administered medications for this visit.    Allergies-reviewed and updated Allergies  Allergen Reactions  . Penicillins Rash    REACTION: Rash    Social History   Social History Narrative   Married. Husband Gwyndolyn Saxon. 2 kids by first marriage. 2 step kids.  7 grandkids.       Retired Loss adjuster, [employer]   Objective  Objective:  BP 124/72   Pulse 75   Temp (!) 97.3 F (36.3 C) (Temporal)   Resp 18   Ht 5\' 3"  (1.6 m)   Wt 184 lb 9.6 oz (83.7 kg)   SpO2 98%   BMI 32.70 kg/m  Gen: NAD, resting comfortably HEENT: Mucous membranes are moist. Oropharynx normal Neck: no thyromegaly CV: RRR no murmurs rubs or gallops Lungs: CTAB no crackles, wheeze, rhonchi Abdomen: soft/nontender/nondistended/normal bowel sounds. No rebound or guarding.  Ext: no edema Skin: warm, dry Neuro: grossly normal, moves all extremities, PERRLA   Assessment and Plan   74 y.o. female presenting for annual physical.   Health Maintenance counseling: 1. Anticipatory guidance: Patient counseled regarding regular dental exams q6 months, eye exams- q2 years per eye doctor,  avoiding smoking and second hand smoke, limiting alcohol to 1 beverage per day- doesn't drink alcohol.   2. Risk factor reduction:  Advised patient of need for regular exercise and diet rich and fruits and vegetables to reduce risk of heart attack and stroke. Exercise- walking 1-2 miles most days. Diet-largely stable weight- trying to avoid red meats and milk due to kidneys- tries to do a lot of veggies, limits bread intake.  Wt Readings from Last 3 Encounters:  01/12/20 184 lb 9.6 oz (83.7 kg)  07/08/19 184 lb 3.2 oz (83.6 kg)  01/02/19 182 lb 12.8 oz (82.9 kg)  3. Immunizations/screenings/ancillary studies- covid booster beginning of the year planned. Also discussed shingrix at pharmacy Immunization History  Administered Date(s) Administered  . Fluad Quad(high Dose 65+) 11/21/2018, 10/27/2019  . Influenza Split 10/22/2012  . Influenza Whole 01/22/2005, 10/23/2006, 10/22/2008, 10/22/2009  . Influenza-Unspecified 10/22/2013, 11/09/2015, 10/31/2017  . Moderna Sars-Covid-2 Vaccination 02/18/2019, 03/18/2019  . Pneumococcal Polysaccharide-23 12/02/2007  . Td 01/22/2001  . Tetanus 02/17/2013  4. Cervical cancer screening- past age based screening recommendations.  Never had abnormal Pap smear.  Denies vaginal bleeding or discharge.  Hysterectomy age 52  5. Breast cancer screening-  breast exam self exams monthly at home and mammogram - 10/21/19- 2nd year in a row had callback for ultrasound for different spot 6. Colon cancer screening - normal February 2015 with plans for 10-year repeat to 12/12/2023. Will be 78 and may not qualify per new guidelines- has not had polyps 7. Skin cancer screening-sees dermatologist in [place] Dr. Nevada Crane. advised regular sunscreen use. Denies worrisome, changing, or new skin lesions.  8. Birth control/STD check-  Hysterectomy/postmenopausal and monogamous 9. Osteoporosis screening at 44- osteopenia noted in the past.  April 07, 2018 normal scores-recommended continuing calcium and vitamin D-consider 5-year repeat -Never smoker  Status of chronic or acute concerns   #hyperlipidemia S: Medication: atorvastatin 20Mg  once a week. Occasional cramps in back of leg but thinks predates meds Lab Results  Component Value Date   CHOL 192 01/02/2019   HDL 48.40 01/02/2019   LDLCALC 122 (H) 01/02/2019   LDLDIRECT 95.0 06/30/2018   TRIG 107.0 01/02/2019   CHOLHDL 4 01/02/2019   A/P:  LDL reduced from 163 to 122 last year- update levels today. With muscle aches prefers not to increase dose  # GERD S:Medication: Pepcid 20Mg  A/P: Controlled with Pepcid alone-continue current medication  #hypertension S: medication: tenoretic 50-25Mg  half tablet only BP Readings from Last 3 Encounters:  01/12/20 124/72  07/08/19 122/70  01/14/19 113/81  A/P: Stable. Continue current medications.   #Chronic kidney disease stage  III S: GFR is typically in the 40srange -Patient knows to avoid NSAIDs- only tylenol as needed  A/P: hopefully stable- update current meds   #Orthopedic concerns-osteoarthritis of left knee as well as history of carpal tunnel surgery-follow-up with Dr. Durward Fortes- hasnt had to see him recently   Recommended follow up: Return in about 6 months (around 07/12/2020) for follow up- or sooner if needed. Future Appointments  Date Time Provider [place]  01/25/2020  9:30 AM [facility]   Lab/Order associations: fasting   ICD-10-CM   1. Preventative health care  O27.74 COMPLETE METABOLIC PANEL WITH GFR    Lipid panel    CBC with Differential/Platelet    Urine Microscopic  2. Essential hypertension  J28 COMPLETE METABOLIC PANEL WITH GFR    Lipid panel    CBC with Differential/Platelet  3. Gastroesophageal reflux disease without esophagitis  K21.9   4. Hyperlipidemia,  unspecified hyperlipidemia type  N86.7 COMPLETE METABOLIC PANEL WITH GFR    Lipid panel    CBC with Differential/Platelet  5. Hematuria, unspecified type  R31.9 Urine Microscopic    No orders of the defined types were placed in this encounter.   Return precautions advised.  [name], MD

## 2020-01-11 NOTE — Patient Instructions (Addendum)
Please stop by lab before you go If you have mychart- we will send your results within 3 business days of [place] receiving them.  If you do not have mychart- we will call you about results within 5 business days of [place] receiving them.  *please note we are currently using [facility] which has a longer processing time than [place] typically so labs may not come back as quickly as in the past *please also note that you will see labs on mychart as soon as they post. I will later go in and write notes on them- will say "notes from Dr. Yong Channel"  Health Maintenance Due  Topic Date Due  . COVID-19 Vaccine (3 - Booster for Moderna series) Plans of getting the shot at the beginning of the year. 09/15/2019   Sometime after covid vaccine. Please check with your pharmacy to see if they have the shingrix vaccine. If they do- please get this immunization and update [place] by phone call or mychart with dates you receive the vaccine

## 2020-01-12 ENCOUNTER — Other Ambulatory Visit: Payer: Self-pay

## 2020-01-12 ENCOUNTER — Encounter: Payer: Self-pay | Admitting: Family Medicine

## 2020-01-12 ENCOUNTER — Ambulatory Visit (INDEPENDENT_AMBULATORY_CARE_PROVIDER_SITE_OTHER): Payer: Medicare Other | Admitting: Family Medicine

## 2020-01-12 VITALS — BP 124/72 | HR 75 | Temp 97.3°F | Resp 18 | Ht 63.0 in | Wt 184.6 lb

## 2020-01-12 DIAGNOSIS — I10 Essential (primary) hypertension: Secondary | ICD-10-CM

## 2020-01-12 DIAGNOSIS — R319 Hematuria, unspecified: Secondary | ICD-10-CM | POA: Diagnosis not present

## 2020-01-12 DIAGNOSIS — K219 Gastro-esophageal reflux disease without esophagitis: Secondary | ICD-10-CM

## 2020-01-12 DIAGNOSIS — Z0000 Encounter for general adult medical examination without abnormal findings: Secondary | ICD-10-CM

## 2020-01-12 DIAGNOSIS — E785 Hyperlipidemia, unspecified: Secondary | ICD-10-CM

## 2020-01-13 LAB — CBC WITH DIFFERENTIAL/PLATELET
Absolute Monocytes: 780 cells/uL (ref 200–950)
Basophils Absolute: 91 cells/uL (ref 0–200)
Basophils Relative: 1.1 %
Eosinophils Absolute: 208 cells/uL (ref 15–500)
Eosinophils Relative: 2.5 %
HCT: 44.9 % (ref 35.0–45.0)
Hemoglobin: 14.8 g/dL (ref 11.7–15.5)
Lymphs Abs: 2357 cells/uL (ref 850–3900)
MCH: 30 pg (ref 27.0–33.0)
MCHC: 33 g/dL (ref 32.0–36.0)
MCV: 90.9 fL (ref 80.0–100.0)
MPV: 10.4 fL (ref 7.5–12.5)
Monocytes Relative: 9.4 %
Neutro Abs: 4864 cells/uL (ref 1500–7800)
Neutrophils Relative %: 58.6 %
Platelets: 366 10*3/uL (ref 140–400)
RBC: 4.94 10*6/uL (ref 3.80–5.10)
RDW: 11.7 % (ref 11.0–15.0)
Total Lymphocyte: 28.4 %
WBC: 8.3 10*3/uL (ref 3.8–10.8)

## 2020-01-13 LAB — COMPLETE METABOLIC PANEL WITH GFR
AG Ratio: 1.4 (calc) (ref 1.0–2.5)
ALT: 22 U/L (ref 6–29)
AST: 26 U/L (ref 10–35)
Albumin: 4.3 g/dL (ref 3.6–5.1)
Alkaline phosphatase (APISO): 55 U/L (ref 37–153)
BUN/Creatinine Ratio: 19 (calc) (ref 6–22)
BUN: 21 mg/dL (ref 7–25)
CO2: 29 mmol/L (ref 20–32)
Calcium: 10.3 mg/dL (ref 8.6–10.4)
Chloride: 101 mmol/L (ref 98–110)
Creat: 1.11 mg/dL — ABNORMAL HIGH (ref 0.60–0.93)
GFR, Est African American: 57 mL/min/{1.73_m2} — ABNORMAL LOW (ref >=60)
GFR, Est Non African American: 49 mL/min/{1.73_m2} — ABNORMAL LOW (ref >=60)
Globulin: 3.1 g/dL (calc) (ref 1.9–3.7)
Glucose, Bld: 78 mg/dL (ref 65–99)
Potassium: 3.7 mmol/L (ref 3.5–5.3)
Sodium: 140 mmol/L (ref 135–146)
Total Bilirubin: 0.7 mg/dL (ref 0.2–1.2)
Total Protein: 7.4 g/dL (ref 6.1–8.1)

## 2020-01-13 LAB — URINALYSIS, MICROSCOPIC ONLY
Bacteria, UA: NONE SEEN /HPF
Hyaline Cast: NONE SEEN /LPF
Squamous Epithelial / HPF: NONE SEEN /HPF (ref <=5)
WBC, UA: NONE SEEN /HPF (ref 0–5)

## 2020-01-13 LAB — LIPID PANEL
Cholesterol: 169 mg/dL (ref <200)
HDL: 49 mg/dL — ABNORMAL LOW (ref >=50)
LDL Cholesterol (Calc): 98 mg/dL (calc)
Non-HDL Cholesterol (Calc): 120 mg/dL (calc) (ref <130)
Total CHOL/HDL Ratio: 3.4 (calc) (ref <5.0)
Triglycerides: 119 mg/dL (ref <150)

## 2020-01-18 ENCOUNTER — Encounter: Payer: Medicare Other | Admitting: Family Medicine

## 2020-01-25 ENCOUNTER — Ambulatory Visit: Payer: Medicare Other

## 2020-01-29 ENCOUNTER — Ambulatory Visit (INDEPENDENT_AMBULATORY_CARE_PROVIDER_SITE_OTHER): Payer: Medicare Other

## 2020-01-29 DIAGNOSIS — Z0000 Encounter for general adult medical examination without abnormal findings: Secondary | ICD-10-CM

## 2020-01-29 NOTE — Progress Notes (Addendum)
Virtual Visit via Telephone Note  I connected with  Noah Delaine on 01/29/20 at  1:00 PM EST by telephone and verified that I am speaking with the correct person using two identifiers.  Medicare Annual Wellness visit completed telephonically due to Covid-19 pandemic.   Persons participating in this call: This Health Coach and this patient.   Location: Patient: Home Provider: Office   I discussed the limitations, risks, security and privacy concerns of performing an evaluation and management service by telephone and the availability of in person appointments. The patient expressed understanding and agreed to proceed.  Unable to perform video visit due to video visit attempted and failed and/or patient does not have video capability.   Some vital signs may be absent or patient reported.   Willette Brace, LPN    Subjective:   BUFORD GAYLER is a 75 y.o. female who presents for Medicare Annual (Subsequent) preventive examination.  Review of Systems     Cardiac Risk Factors include: advanced age (>21men, >39 women);dyslipidemia;hypertension;obesity (BMI >30kg/m2)     Objective:    There were no vitals filed for this visit. There is no height or weight on file to calculate BMI.  Advanced Directives 01/29/2020 12/31/2017 04/02/2016 06/02/2015  Does Patient Have a Medical Advance Directive? Yes No No No  Type of Paramedic of [place];Living will - - -  Copy of [place] in Chart? No - copy requested - - -  Would patient like information on creating a medical advance directive? - No - Patient declined - -    Current Medications (verified) Outpatient Encounter Medications as of 01/29/2020  Medication Sig  . Ascorbic Acid (VITAMIN C PO) Take by mouth.  Marland Kitchen aspirin 81 MG tablet Take 81 mg by mouth daily.  Marland Kitchen atenolol-chlorthalidone (TENORETIC) 50-25 MG tablet Take half tablet daily  . atorvastatin (LIPITOR) 20 MG tablet TAKE 1 TABLET(20 MG) BY  MOUTH 1 TIME A WEEK  . calcium citrate-vitamin D (CITRACAL+D) 315-200 MG-UNIT per tablet Take 1 tablet by mouth daily.  . famotidine (PEPCID) 20 MG tablet Take 1 tablet (20 mg total) by mouth 2 (two) times daily. (Patient taking differently: Take 20 mg by mouth daily.)  . fexofenadine (ALLEGRA) 180 MG tablet Take 180 mg by mouth daily.  . fluticasone (FLONASE) 50 MCG/ACT nasal spray PLACE 2 SPRAYS IN EACH NOSTRIL DAILY   No facility-administered encounter medications on file as of 01/29/2020.    Allergies (verified) Penicillins   History: Past Medical History:  Diagnosis Date  . Allergy   . Arthritis   . Carpal tunnel syndrome of left wrist   . Carpal tunnel syndrome of right wrist   . Chronic left shoulder pain 05/31/2011   Frozen shoulder history- did exercises and eventually resolved  . Hypertension   . Lichen planus 10/93/2355   Qualifier: Diagnosis of  By: Sherren Mocha MD, Jory Ee    Past Surgical History:  Procedure Laterality Date  . CARPAL TUNNEL RELEASE     rt  . CATARACT EXTRACTION W/ INTRAOCULAR LENS  IMPLANT, BILATERAL    . PARATHYROIDECTOMY  2004   1 gland removed  . PLANTAR FASCIA SURGERY  2005   left  . TONSILLECTOMY    . VAGINAL HYSTERECTOMY     Family History  Problem Relation Age of Onset  . Alzheimer's disease Mother   . Leukemia Father   . CVA Father   . Hypertension Father   . Hypertension Sister   .  Plantar fasciitis Daughter   . Diverticulitis Son   . Lymphoma Maternal Grandmother   . Early death Maternal Grandfather   . Stroke Paternal Grandmother   . Dementia Paternal Grandmother   . Stroke Paternal Grandfather   . Hypertension Paternal Grandfather   . Colon cancer Neg Hx    Social History   Socioeconomic History  . Marital status: Married    Spouse name: Not on file  . Number of children: Not on file  . Years of education: Not on file  . Highest education level: Not on file  Occupational History  . Occupation: retired   Tobacco Use  .  Smoking status: Never Smoker  . Smokeless tobacco: Never Used  Vaping Use  . Vaping Use: Never used  Substance and Sexual Activity  . Alcohol use: No  . Drug use: No  . Sexual activity: Yes  Other Topics Concern  . Not on file  Social History Narrative   Married. Husband Gwyndolyn Saxon. 2 kids by first marriage. 2 step kids. 7 grandkids.       Retired Loss adjuster, [employer]   Social Determinants of Radio broadcast assistant Strain: [place]   . Difficulty of Paying Living Expenses: Not hard at all  Food Insecurity: No Food Insecurity  . Worried About Charity fundraiser in the Last Year: Never true  . Ran Out of Food in the Last Year: Never true  Transportation Needs: No Transportation Needs  . Lack of Transportation (Medical): No  . Lack of Transportation (Non-Medical): No  Physical Activity: Sufficiently Active  . Days of Exercise per Week: 5 days  . Minutes of Exercise per Session: 40 min  Stress: No Stress Concern Present  . Feeling of Stress : Not at all  Social Connections: Moderately Integrated  . Frequency of Communication with Friends and Family: More than three times a week  . Frequency of Social Gatherings with Friends and Family: Once a week  . Attends Religious Services: More than 4 times per year  . Active Member of Clubs or Organizations: No  . Attends Archivist Meetings: Never  . Marital Status: Married    Tobacco Counseling Counseling given: Not Answered   Clinical Intake:  Pre-visit preparation completed: Yes  Pain : No/denies pain     BMI - recorded: 32.71 Nutritional Status: BMI > 30  Obese Diabetes: No  How often do you need to have someone help you when you read instructions, pamphlets, or other written materials from your doctor or pharmacy?: 1 - Never  Diabetic?No  Interpreter Needed?: No  Information entered by :: Charlott Rakes, LPN   Activities of Daily Living In your present state of health, do you have  any difficulty performing the following activities: 01/29/2020 01/12/2020  Hearing? N N  Vision? N N  Difficulty concentrating or making decisions? N N  Walking or climbing stairs? Y N  Comment careful when go up stairs -  Dressing or bathing? N N  Doing errands, shopping? N N  Preparing Food and eating ? N -  Using the Toilet? N -  Managing your Medications? N -  Managing your Finances? N -  Housekeeping or managing your Housekeeping? N -  Some recent data might be hidden    Patient Care Team: [name], MD as PCP - General (Family Medicine) Garrel Ridgel, [place] as Consulting Physician (Podiatry) Garald Balding, MD as Consulting Physician (Orthopedic Surgery) [name] Kirke Corin, MD  as Consulting Physician (Gastroenterology)  Indicate any recent Medical Services you may have received from other than [facility] providers in the past year (date may be approximate).     Assessment:   This is a routine wellness examination for Daphney.  Hearing/Vision screen  Hearing Screening   125Hz  250Hz  500Hz  1000Hz  2000Hz  3000Hz  4000Hz  6000Hz  8000Hz   Right ear:           Left ear:           Comments: Pt denies any hearing issues  Vision Screening Comments: Dr at [facility] in [place] for an annual   Dietary issues and exercise activities discussed: Current Exercise Habits: Home exercise routine, Type of exercise: walking, Time (Minutes): 45, Frequency (Times/Week): 5, Weekly Exercise (Minutes/Week): 225  Goals    . Exercise 150 min/wk Moderate Activity     Joining [employer] January 22, 2018. Plans to join the [place],    . Patient Stated     Stay healthy as I am     . Weight (lb) < 180 lb (81.6 kg)     Check out  online nutrition programs as and ; fit74me;  Look for foods with "whole" wheat; bran; oatmeal etc Shot at the farmer's markets in season for fresher choices  Watch for "hydrogenated" on the label of oils which are  trans-fats.  Watch for "high fructose corn syrup" in snacks, yogurt or ketchup  Meats have less marbling; bright colored fruits and vegetables;  Canned; dump out liquid and wash vegetables. Be mindful of what we are eating  Portion control is essential to a health weight! Sit down; take a break and enjoy your meal; take smaller bites; put the fork down between bites;  It takes 20 minutes to get full; so check in with your fullness cues and stop eating when you start to fill full             Depression Screen PHQ 2/9 Scores 01/29/2020 07/08/2019 01/14/2019 01/14/2019 01/02/2019 12/31/2017 12/25/2017  PHQ - 2 Score 0 0 0 0 0 0 0  PHQ- 9 Score - 0 - 0 0 - -    Fall Risk Fall Risk  01/29/2020 01/14/2019 01/02/2019 06/27/2018 12/31/2017  Falls in the past year? 0 0 0 0 0  Comment - - - - -  Number falls in past yr: 0 0 0 0 -  Comment - - - - -  Injury with Fall? 0 - 0 0 -  Follow up Falls prevention discussed - - - -    FALL RISK PREVENTION PERTAINING TO THE HOME:  Any stairs in or around the home? Yes  If so, are there any without handrails? No  Home free of loose throw rugs in walkways, pet beds, electrical cords, etc? Yes  Adequate lighting in your home to reduce risk of falls? Yes   ASSISTIVE DEVICES UTILIZED TO PREVENT FALLS:  Life alert? No  Use of a cane, walker or w/c? No  Grab bars in the bathroom? No  Shower chair or bench in shower? No  Elevated toilet seat or a handicapped toilet? Yes   TIMED UP AND GO:  Was the test performed? No    Cognitive Function: MMSE - Mini Mental State Exam 04/02/2016  Not completed: (No Data)     6CIT Screen 01/29/2020 12/31/2017  What Year? 0 points 0 points  What month? 0 points 0 points  What time? - 0 points  Count back from  20 0 points 0 points  Months in reverse 0 points 0 points  Repeat phrase 0 points 0 points  Total Score - 0    Immunizations Immunization History  Administered Date(s) Administered  . Fluad Quad(high  Dose 65+) 11/21/2018, 10/27/2019  . Influenza Split 10/22/2012  . Influenza Whole 01/22/2005, 10/23/2006, 10/22/2008, 10/22/2009  . Influenza-Unspecified 10/22/2013, 11/09/2015, 10/31/2017  . Moderna Sars-Covid-2 Vaccination 02/18/2019, 03/18/2019  . Pneumococcal Polysaccharide-23 12/02/2007  . Td 01/22/2001  . Tetanus 02/17/2013    TDAP status: Up to date  Flu Vaccine status: Up to date  Done 10/27/19 Pneumococcal vaccine status: Up to date / Discontinued  Covid-19 vaccine status: Completed vaccines  Pt states she will schedule for Booster this month Qualifies for Shingles Vaccine? Yes   Zostavax completed No   Shingrix Completed?: No.    Education has been provided regarding the importance of this vaccine. Patient has been advised to call insurance company to determine out of pocket expense if they have not yet received this vaccine. Advised may also receive vaccine at local pharmacy or Health Dept. Verbalized acceptance and understanding.  Screening Tests Health Maintenance  Topic Date Due  . COVID-19 Vaccine (3 - Booster for Moderna series) 09/15/2019  . MAMMOGRAM  10/20/2021  . TETANUS/TDAP  02/18/2023  . COLONOSCOPY (Pts 45-26yrs Insurance coverage will need to be confirmed)  03/05/2023  . INFLUENZA VACCINE  Completed  . DEXA SCAN  Completed  . Hepatitis C Screening  Completed  . PNA vac Low Risk Adult  Discontinued    Health Maintenance  Health Maintenance Due  Topic Date Due  . COVID-19 Vaccine (3 - Booster for Moderna series) 09/15/2019    Colorectal cancer screening: Type of screening: Colonoscopy. Completed 03/04/13. Repeat every 10 years  Mammogram status: Completed 10/21/19. Repeat every year  Bone Density status: Completed 04/07/18. Results reflect: Bone density results: NORMAL. Repeat every 2 years.   Additional Screening:  Hepatitis C Screening:  Completed 04/02/16  Vision Screening: Recommended annual ophthalmology exams for early detection of glaucoma and  other disorders of the eye. Is the patient up to date with their annual eye exam?  Yes  Who is the provider or what is the name of the office in which the patient attends annual eye exams? Dr Charise Killian  Dental Screening: Recommended annual dental exams for proper oral hygiene  Community Resource Referral / Chronic Care Management: CRR required this visit?  No   CCM required this visit?  No      Plan:     I have personally reviewed and noted the following in the patient's chart:   . Medical and social history . Use of alcohol, tobacco or illicit drugs  . Current medications and supplements . Functional ability and status . Nutritional status . Physical activity . Advanced directives . List of other physicians . Hospitalizations, surgeries, and ER visits in previous 12 months . Vitals . Screenings to include cognitive, depression, and falls . Referrals and appointments  In addition, I have reviewed and discussed with patient certain preventive protocols, quality metrics, and best practice recommendations. A written personalized care plan for preventive services as well as general preventive health recommendations were provided to patient.     Marzella Schlein, LPN   02/26/971   Nurse Notes: None

## 2020-01-29 NOTE — Patient Instructions (Addendum)
Marie Ramirez , Thank you for taking time to come for your Medicare Wellness Visit. I appreciate your ongoing commitment to your health goals. Please review the following plan we discussed and let me know if I can assist you in the future.   Screening recommendations/referrals: Colonoscopy: Done 03/04/13 Mammogram: Done 10/21/19 Bone Density: Done 04/07/18 Recommended yearly ophthalmology/optometry visit for glaucoma screening and checkup Recommended yearly dental visit for hygiene and checkup  Vaccinations: Influenza vaccine: Done 10/27/19 Up to date Pneumococcal vaccine: Done 12/02/07 Tdap vaccine: Up to date Done 02/17/13 Shingles vaccine: Shingrix discussed. Please contact your pharmacy for coverage information.    Covid-19:Completed 1/27 & 03/18/19  Advanced directives: Please bring a copy of your health care power of attorney and living will to the office at your convenience.  Conditions/risks identified: Stay healthy  Next appointment: Follow up in one year for your annual wellness visit    Preventive Care 65 Years and Older, Female Preventive care refers to lifestyle choices and visits with your health care provider that can promote health and wellness. What does preventive care include?  A yearly physical exam. This is also called an annual well check.  Dental exams once or twice a year.  Routine eye exams. Ask your health care provider how often you should have your eyes checked.  Personal lifestyle choices, including:  Daily care of your teeth and gums.  Regular physical activity.  Eating a healthy diet.  Avoiding tobacco and drug use.  Limiting alcohol use.  Practicing safe sex.  Taking low-dose aspirin every day.  Taking vitamin and mineral supplements as recommended by your health care provider. What happens during an annual well check? The services and screenings done by your health care provider during your annual well check will depend on your age, overall  health, lifestyle risk factors, and family history of disease. Counseling  Your health care provider may ask you questions about your:  Alcohol use.  Tobacco use.  Drug use.  Emotional well-being.  Home and relationship well-being.  Sexual activity.  Eating habits.  History of falls.  Memory and ability to understand (cognition).  Work and work Statistician.  Reproductive health. Screening  You may have the following tests or measurements:  Height, weight, and BMI.  Blood pressure.  Lipid and cholesterol levels. These may be checked every 5 years, or more frequently if you are over 51 years old.  Skin check.  Lung cancer screening. You may have this screening every year starting at age 66 if you have a 30-pack-year history of smoking and currently smoke or have quit within the past 15 years.  Fecal occult blood test (FOBT) of the stool. You may have this test every year starting at age 34.  Flexible sigmoidoscopy or colonoscopy. You may have a sigmoidoscopy every 5 years or a colonoscopy every 10 years starting at age 30.  Hepatitis C blood test.  Hepatitis B blood test.  Sexually transmitted disease (STD) testing.  Diabetes screening. This is done by checking your blood sugar (glucose) after you have not eaten for a while (fasting). You may have this done every 1-3 years.  Bone density scan. This is done to screen for osteoporosis. You may have this done starting at age 35.  Mammogram. This may be done every 1-2 years. Talk to your health care provider about how often you should have regular mammograms. Talk with your health care provider about your test results, treatment options, and if necessary, the need for more tests.  Vaccines  Your health care provider may recommend certain vaccines, such as:  Influenza vaccine. This is recommended every year.  Tetanus, diphtheria, and acellular pertussis (Tdap, Td) vaccine. You may need a Td booster every 10  years.  Zoster vaccine. You may need this after age 11.  Pneumococcal 13-valent conjugate (PCV13) vaccine. One dose is recommended after age 54.  Pneumococcal polysaccharide (PPSV23) vaccine. One dose is recommended after age 33. Talk to your health care provider about which screenings and vaccines you need and how often you need them. This information is not intended to replace advice given to you by your health care provider. Make sure you discuss any questions you have with your health care provider. Document Released: 02/04/2015 Document Revised: 09/28/2015 Document Reviewed: 11/09/2014 Elsevier Interactive Patient Education  2017 [employer] Prevention in the Home Falls can cause injuries. They can happen to people of all ages. There are many things you can do to make your home safe and to help prevent falls. What can I do on the outside of my home?  Regularly fix the edges of walkways and driveways and fix any cracks.  Remove anything that might make you trip as you walk through a door, such as a raised step or threshold.  Trim any bushes or trees on the path to your home.  Use bright outdoor lighting.  Clear any walking paths of anything that might make someone trip, such as rocks or tools.  Regularly check to see if handrails are loose or broken. Make sure that both sides of any steps have handrails.  Any raised decks and porches should have guardrails on the edges.  Have any leaves, snow, or ice cleared regularly.  Use sand or salt on walking paths during winter.  Clean up any spills in your garage right away. This includes oil or grease spills. What can I do in the bathroom?  Use night lights.  Install grab bars by the toilet and in the tub and shower. Do not use towel bars as grab bars.  Use non-skid mats or decals in the tub or shower.  If you need to sit down in the shower, use a plastic, non-slip stool.  Keep the floor dry. Clean up any water that  spills on the floor as soon as it happens.  Remove soap buildup in the tub or shower regularly.  Attach bath mats securely with double-sided non-slip rug tape.  Do not have throw rugs and other things on the floor that can make you trip. What can I do in the bedroom?  Use night lights.  Make sure that you have a light by your bed that is easy to reach.  Do not use any sheets or blankets that are too big for your bed. They should not hang down onto the floor.  Have a firm chair that has side arms. You can use this for support while you get dressed.  Do not have throw rugs and other things on the floor that can make you trip. What can I do in the kitchen?  Clean up any spills right away.  Avoid walking on wet floors.  Keep items that you use a lot in easy-to-reach places.  If you need to reach something above you, use a strong step stool that has a grab bar.  Keep electrical cords out of the way.  Do not use floor polish or wax that makes floors slippery. If you must use wax, use non-skid floor wax.  Do not have throw rugs and other things on the floor that can make you trip. What can I do with my stairs?  Do not leave any items on the stairs.  Make sure that there are handrails on both sides of the stairs and use them. Fix handrails that are broken or loose. Make sure that handrails are as long as the stairways.  Check any carpeting to make sure that it is firmly attached to the stairs. Fix any carpet that is loose or worn.  Avoid having throw rugs at the top or bottom of the stairs. If you do have throw rugs, attach them to the floor with carpet tape.  Make sure that you have a light switch at the top of the stairs and the bottom of the stairs. If you do not have them, ask someone to add them for you. What else can I do to help prevent falls?  Wear shoes that:  Do not have high heels.  Have rubber bottoms.  Are comfortable and fit you well.  Are closed at the  toe. Do not wear sandals.  If you use a stepladder:  Make sure that it is fully opened. Do not climb a closed stepladder.  Make sure that both sides of the stepladder are locked into place.  Ask someone to hold it for you, if possible.  Clearly mark and make sure that you can see:  Any grab bars or handrails.  First and last steps.  Where the edge of each step is.  Use tools that help you move around (mobility aids) if they are needed. These include:  Canes.  Walkers.  Scooters.  Crutches.  Turn on the lights when you go into a dark area. Replace any light bulbs as soon as they burn out.  Set up your furniture so you have a clear path. Avoid moving your furniture around.  If any of your floors are uneven, fix them.  If there are any pets around you, be aware of where they are.  Review your medicines with your doctor. Some medicines can make you feel dizzy. This can increase your chance of falling. Ask your doctor what other things that you can do to help prevent falls. This information is not intended to replace advice given to you by your health care provider. Make sure you discuss any questions you have with your health care provider. Document Released: 11/04/2008 Document Revised: 06/16/2015 Document Reviewed: 02/12/2014 Elsevier Interactive Patient Education  2017 [employer].

## 2020-02-05 ENCOUNTER — Other Ambulatory Visit: Payer: Self-pay

## 2020-02-05 MED ORDER — ATENOLOL-CHLORTHALIDONE 50-25 MG PO TABS: ORAL_TABLET | ORAL | 3 refills | Status: DC

## 2020-04-12 ENCOUNTER — Telehealth: Payer: Self-pay | Admitting: Family Medicine

## 2020-04-12 NOTE — Chronic Care Management (AMB) (Signed)
  Chronic Care Management   Outreach Note  04/12/2020 Name: Marie Ramirez MRN: [id number] DOB: [birth year]  Referred by: [name], MD Reason for referral : No chief complaint on file.   An unsuccessful telephone outreach was attempted today. The patient was referred to the pharmacist for assistance with care management and care coordination.   Follow Up Plan:   Lauretta Grill Upstream Scheduler

## 2020-06-24 ENCOUNTER — Telehealth: Payer: Self-pay | Admitting: Family Medicine

## 2020-06-24 NOTE — Progress Notes (Signed)
  Chronic Care Management   Outreach Note  06/24/2020 Name: NEZIAH VOGELGESANG MRN: [id number] DOB: Dec 27, 1945  Referred by: [name], MD Reason for referral : No chief complaint on file.   An unsuccessful telephone outreach was attempted today. The patient was referred to the pharmacist for assistance with care management and care coordination.   Follow Up Plan:   Lauretta Grill Upstream Scheduler

## 2020-07-20 ENCOUNTER — Other Ambulatory Visit: Payer: Self-pay

## 2020-07-20 ENCOUNTER — Ambulatory Visit (INDEPENDENT_AMBULATORY_CARE_PROVIDER_SITE_OTHER): Payer: Medicare Other | Admitting: Orthopaedic Surgery

## 2020-07-20 ENCOUNTER — Ambulatory Visit: Payer: Self-pay

## 2020-07-20 ENCOUNTER — Encounter: Payer: Self-pay | Admitting: Orthopaedic Surgery

## 2020-07-20 VITALS — Ht 63.0 in | Wt 184.0 lb

## 2020-07-20 DIAGNOSIS — M25562 Pain in left knee: Secondary | ICD-10-CM

## 2020-07-20 DIAGNOSIS — M1712 Unilateral primary osteoarthritis, left knee: Secondary | ICD-10-CM

## 2020-07-20 MED ORDER — BUPIVACAINE HCL 0.25 % IJ SOLN
2.0000 mL | INTRAMUSCULAR | Status: AC | PRN
Start: 2020-07-20 — End: 2020-07-20
  Administered 2020-07-20: 10:00:00 2 mL via INTRA_ARTICULAR

## 2020-07-20 NOTE — Progress Notes (Signed)
Office Visit Note   Patient: Marie Ramirez           Date of Birth: [birth year]           MRN: [id number] Visit Date: 07/20/2020              Requested by: [name], MD [place],  [place] [address] PCP: [name], MD   Assessment & Plan: Visit Diagnoses:  1. Chronic pain of left knee   2. Primary osteoarthritis of left knee     Plan: Marie Ramirez has osteoarthritis of both knees.  Procedure in the past for the right knee and on this occasion she is having a problem along the lateral compartment of the left knee.  There were some degenerative changes by film but not much.  I injected the knee from a diagnostic and therapeutic standpoint along the lateral compartment with betamethasone and Marcaine and we will monitor her response  Follow-Up Instructions: Return if symptoms worsen or fail to improve.   Orders:  Orders Placed This Encounter  Procedures   Large Joint Inj: L knee   XR KNEE 3 VIEW LEFT   No orders of the defined types were placed in this encounter.     Procedures: Large Joint Inj: L knee on 07/20/2020 9:51 AM Indications: pain and diagnostic evaluation Details: 25 G 1.5 in needle, anterolateral approach  Arthrogram: No  Medications: 2 mL bupivacaine 0.25 %  12 mg betamethasone injected with 2 mL of 1.25% Marcaine into the lateral compartment left knee Procedure, treatment alternatives, risks and benefits explained, specific risks discussed. Consent was given by the patient. Patient was prepped and draped in the usual sterile fashion.      Clinical Data: No additional findings.   Subjective: Chief Complaint  Patient presents with   Left Knee - Pain  Patient presents today for left knee pain. She said that it has been hurting for 3-70months. Her pain is medial and lateraly. No grinding or giving way. She cannot squat. Going up or down stairs makes it worse. No previous knee surgery. She takes Tylenol as  needed.  HPI  Review of Systems   Objective: Vital Signs: Ht 5\' 3"  (1.6 m)   Wt 184 lb (83.5 kg)   BMI 32.59 kg/m   Physical Exam Constitutional:      Appearance: She is well-developed.  Eyes:     Pupils: Pupils are equal, round, and reactive to light.  Pulmonary:     Effort: Pulmonary effort is normal.  Skin:    General: Skin is warm and dry.  Neurological:     Mental Status: She is alert and oriented to person, place, and time.  Psychiatric:        Behavior: Behavior normal.    Ortho Exam awake alert and oriented x3.  Comfortable sitting left knee with pain along the anterior lateral joint.  No pain medially.  Mild patella crepitation but no pain with compression.  No effusion or evidence of instability.  No popliteal pain or mass.  No calf pain.  Good capillary refill to toes.  Does have varicose veins about the knee  Specialty Comments:  No specialty comments available.  Imaging: XR KNEE 3 VIEW LEFT  Result Date: 07/20/2020 Films of the left knee are obtained in 3 projections standing.  Alignment is neutral.  There are few very small osteophytes in the lateral compartment.  No ectopic calcification.  Minimal degenerative change.  Patient is  symptomatic along the lateral joint.  There was some very minimal subchondral sclerosis.  Patella tracks in the midline    PMFS History: Patient Active Problem List   Diagnosis Date Noted   CKD (chronic kidney disease), stage III ([place]) 12/25/2017   Carpal tunnel syndrome, left upper limb 10/16/2017   Hyperlipidemia 04/04/2016   Osteoarthritis of left knee 02/20/2016   GERD (gastroesophageal reflux disease) 05/09/2015   Osteopenia 03/15/2014   Vaginitis, atrophic 02/24/2014   Obesity 05/31/2011   Hematuria 12/17/2006   Essential hypertension 09/06/2006   Allergic rhinitis 09/06/2006   Past Medical History:  Diagnosis Date   Allergy    Arthritis    Carpal tunnel syndrome of left wrist    Carpal tunnel syndrome of right  wrist    Chronic left shoulder pain 05/31/2011   Frozen shoulder history- did exercises and eventually resolved   Hypertension    Lichen planus 02/40/9735   Qualifier: Diagnosis of  By: Sherren Mocha MD, Jory Ee     Family History  Problem Relation Age of Onset   Alzheimer's disease Mother    Leukemia Father    CVA Father    Hypertension Father    Hypertension Sister    Plantar fasciitis Daughter    Diverticulitis Son    Lymphoma Maternal Grandmother    Early death Maternal Grandfather    Stroke Paternal Grandmother    Dementia Paternal Grandmother    Stroke Paternal Grandfather    Hypertension Paternal Grandfather    Colon cancer Neg Hx     Past Surgical History:  Procedure Laterality Date   CARPAL TUNNEL RELEASE     rt   CATARACT EXTRACTION W/ INTRAOCULAR LENS  IMPLANT, BILATERAL     PARATHYROIDECTOMY  2004   1 gland removed   PLANTAR FASCIA SURGERY  2005   left   TONSILLECTOMY     VAGINAL HYSTERECTOMY     Social History   Occupational History   Occupation: retired   Tobacco Use   Smoking status: Never   Smokeless tobacco: Never  Vaping Use   Vaping Use: Never used  Substance and Sexual Activity   Alcohol use: No   Drug use: No   Sexual activity: Yes

## 2020-07-22 ENCOUNTER — Ambulatory Visit: Payer: Medicare Other | Admitting: Family Medicine

## 2020-07-27 ENCOUNTER — Ambulatory Visit (INDEPENDENT_AMBULATORY_CARE_PROVIDER_SITE_OTHER): Payer: Medicare Other | Admitting: Family Medicine

## 2020-07-27 ENCOUNTER — Encounter: Payer: Self-pay | Admitting: Family Medicine

## 2020-07-27 ENCOUNTER — Other Ambulatory Visit: Payer: Self-pay

## 2020-07-27 VITALS — BP 127/76 | HR 58 | Temp 98.5°F | Ht 63.0 in | Wt 181.2 lb

## 2020-07-27 DIAGNOSIS — I10 Essential (primary) hypertension: Secondary | ICD-10-CM | POA: Diagnosis not present

## 2020-07-27 DIAGNOSIS — E785 Hyperlipidemia, unspecified: Secondary | ICD-10-CM

## 2020-07-27 DIAGNOSIS — N183 Chronic kidney disease, stage 3 unspecified: Secondary | ICD-10-CM | POA: Diagnosis not present

## 2020-07-27 DIAGNOSIS — K219 Gastro-esophageal reflux disease without esophagitis: Secondary | ICD-10-CM

## 2020-07-27 LAB — COMPREHENSIVE METABOLIC PANEL
ALT: 17 U/L (ref 0–35)
AST: 20 U/L (ref 0–37)
Albumin: 4.2 g/dL (ref 3.5–5.2)
Alkaline Phosphatase: 57 U/L (ref 39–117)
BUN: 26 mg/dL — ABNORMAL HIGH (ref 6–23)
CO2: 29 mEq/L (ref 19–32)
Calcium: 10.1 mg/dL (ref 8.4–10.5)
Chloride: 103 mEq/L (ref 96–112)
Creatinine, Ser: 1.18 mg/dL (ref 0.40–1.20)
GFR: 45.81 mL/min — ABNORMAL LOW (ref >60.00)
Glucose, Bld: 86 mg/dL (ref 70–99)
Potassium: 3.6 mEq/L (ref 3.5–5.1)
Sodium: 140 mEq/L (ref 135–145)
Total Bilirubin: 0.7 mg/dL (ref 0.2–1.2)
Total Protein: 7.4 g/dL (ref 6.0–8.3)

## 2020-07-27 LAB — LDL CHOLESTEROL, DIRECT: Direct LDL: 87 mg/dL

## 2020-07-27 NOTE — Patient Instructions (Addendum)
Health Maintenance Due  Topic Date Due   Zoster Vaccines- Shingrix (1 of 2) Please check with your pharmacy to see if they have the shingrix vaccine. If they do- please get this immunization and update [place] by phone call or mychart with dates you receive the vaccine  Never done   COVID-19 Vaccine (3 - Booster for Moderna series) Patient will get this scheduled and let us know 08/15/2019    Please stop by lab before you go If you have mychart- we will send your results within 3 business days of [place] receiving them.  If you do not have mychart- we will call you about results within 5 business days of [place] receiving them.  *please also note that you will see labs on mychart as soon as they post. I will later go in and write notes on them- will say "notes from Dr. Yong Channel"   No changes today unless labs lead [place] to make changes- other than stopping aspirin as discussed  Recommended follow up: Return in about 6 months (around 01/27/2021) for physical or sooner if needed.

## 2020-07-27 NOTE — Progress Notes (Signed)
Phone [phone] In person visit   Subjective:   Marie Ramirez is a 75 y.o. year old very pleasant female patient who presents for/with See problem oriented charting Chief Complaint  Patient presents with   Hypertension   Hyperlipidemia   Chronic Kidney Disease    Stage 3     This visit occurred during the SARS-CoV-2 public health emergency.  Safety protocols were in place, including screening questions prior to the visit, additional usage of staff PPE, and extensive cleaning of exam room while observing appropriate contact time as indicated for disinfecting solutions.   Past Medical History-  Patient Active Problem List   Diagnosis Date Noted   CKD (chronic kidney disease), stage III ([place]) 12/25/2017    Priority: Medium   Hyperlipidemia 04/04/2016    Priority: Medium   GERD (gastroesophageal reflux disease) 05/09/2015    Priority: Medium   Vaginitis, atrophic 02/24/2014    Priority: Medium   Hematuria 12/17/2006    Priority: Medium   Essential hypertension 09/06/2006    Priority: Medium   Carpal tunnel syndrome, left upper limb 10/16/2017    Priority: Low   Osteoarthritis of left knee 02/20/2016    Priority: Low   Osteopenia 03/15/2014    Priority: Low   Obesity 05/31/2011    Priority: Low   Allergic rhinitis 09/06/2006    Priority: Low    Medications- reviewed and updated Current Outpatient Medications  Medication Sig Dispense Refill   aspirin 81 MG tablet Take 81 mg by mouth daily.     atenolol-chlorthalidone (TENORETIC) 50-25 MG tablet Take half tablet daily 46 tablet 3   atorvastatin (LIPITOR) 20 MG tablet TAKE 1 TABLET(20 MG) BY MOUTH 1 TIME A WEEK 13 tablet 3   calcium citrate-vitamin D (CITRACAL+D) 315-200 MG-UNIT per tablet Take 1 tablet by mouth daily.     famotidine (PEPCID) 20 MG tablet Take 1 tablet (20 mg total) by mouth 2 (two) times daily. (Patient taking differently: Take 20 mg by mouth daily.) 180 tablet 3   fexofenadine (ALLEGRA) 180 MG tablet  Take 180 mg by mouth daily.     fluticasone (FLONASE) 50 MCG/ACT nasal spray PLACE 2 SPRAYS IN EACH NOSTRIL DAILY 32 g 5   Ascorbic Acid (VITAMIN C PO) Take by mouth. (Patient not taking: Reported on 07/27/2020)     No current facility-administered medications for this visit.     Objective:  BP 127/76   Pulse (!) 58   Temp 98.5 F (36.9 C) (Temporal)   Ht 5\' 3"  (1.6 m)   Wt 181 lb 3.2 oz (82.2 kg)   SpO2 98%   BMI 32.10 kg/m  Gen: NAD, resting comfortably CV: RRR no murmurs rubs or gallops Lungs: CTAB no crackles, wheeze, rhonchi Ext: trace edema Skin: warm, dry    Assessment and Plan    #history covid 19 in January 2022- was not bad for her thankfully- some sore throat and fever and headache mainly. Isolated from her husband. Had 2 immunizations from moderna prior to this- is considering #3 in the fall  #hypertension #CKD III S: medication: atenolol chlorthalidone 50-25mg - half tablet -typically GFR in 40s range- knows tylenol only- avoid nsaids  BP Readings from Last 3 Encounters:  07/27/20 127/76  01/12/20 124/72  07/08/19 122/70  Home readings #s: not checking lately -keeping up her walking up to 6 days a week A/P: Hypertension- Stable. Continue current medications.   CKD III- hopefully stable- update GFR/CMP today.  -down 3 lbs from last visit  #  hyperlipidemia-LDL typically under 100 S: Medication:Atorvastatin 20 mg once a week -Does get some cramping in the back of her legs at times but believes predates meds- no issues lately Lab Results  Component Value Date   CHOL 169 01/12/2020   HDL 49 (L) 01/12/2020   LDLCALC 98 01/12/2020   LDLDIRECT 95.0 06/30/2018   TRIG 119 01/12/2020   CHOLHDL 3.4 01/12/2020  A/P: Cholesterol slightly above ideal goal of 70 - at 98 last check- we will recheck  today- likely continue current meds unless increase in LDL  -has been on aspirin for years as recommended by Dr. Sherren Mocha- has some easy bruising and no CAD/CVA history- so we  opted to stop this   # GERD S:Medication: Pepcid 20 mg A/P: Stable. Continue current medications.     #Orthopedic concerns- Dr. Durward Fortes has seen her in the past. Deals with OA left knee (and right knee recently) and carpal tunnel surgery  #Osteopenia history- noted in the past but normal scores April 07, 2018 other than -1.0 at lumbar spine - she is on calcium and vitamin D and we are considering 5 year repeat  - prefers to stay off meds if at all possible  Recommended follow up: Return in about 6 months (around 01/27/2021) for physical or sooner if needed. Future Appointments  Date Time Provider [place]  02/03/2021  1:00 PM [facility]   Lab/Order associations:   ICD-10-CM   1. Essential hypertension  I10 Comprehensive metabolic panel    2. Stage 3 chronic kidney disease, unspecified whether stage 3a or 3b CKD (HCC)  N18.30     3. Hyperlipidemia, unspecified hyperlipidemia type  E78.5 LDL cholesterol, direct    4. Gastroesophageal reflux disease without esophagitis  K21.9       No orders of the defined types were placed in this encounter.   Return precautions advised.  [name], MD

## 2020-08-11 ENCOUNTER — Telehealth: Payer: Self-pay | Admitting: Family Medicine

## 2020-08-11 NOTE — Chronic Care Management (AMB) (Signed)
  Chronic Care Management   Outreach Note  08/11/2020 Name: RYELEE ALBEE MRN: [id number] DOB: 12-15-1945  Referred by: [name], MD Reason for referral : No chief complaint on file.   A second unsuccessful telephone outreach was attempted today. The patient was referred to pharmacist for assistance with care management and care coordination.  Follow Up Plan:   Lauretta Grill Upstream Scheduler

## 2020-09-09 ENCOUNTER — Other Ambulatory Visit: Payer: Self-pay | Admitting: Family Medicine

## 2020-10-26 ENCOUNTER — Other Ambulatory Visit: Payer: Self-pay

## 2020-10-26 ENCOUNTER — Ambulatory Visit (INDEPENDENT_AMBULATORY_CARE_PROVIDER_SITE_OTHER): Payer: Medicare Other | Admitting: Orthopaedic Surgery

## 2020-10-26 ENCOUNTER — Encounter: Payer: Self-pay | Admitting: Orthopaedic Surgery

## 2020-10-26 DIAGNOSIS — M1712 Unilateral primary osteoarthritis, left knee: Secondary | ICD-10-CM | POA: Diagnosis not present

## 2020-10-26 MED ORDER — LIDOCAINE HCL 1 % IJ SOLN
2.0000 mL | INTRAMUSCULAR | Status: AC | PRN
  Administered 2020-10-26: 2 mL

## 2020-10-26 MED ORDER — BUPIVACAINE HCL 0.25 % IJ SOLN
2.0000 mL | INTRAMUSCULAR | Status: AC | PRN
  Administered 2020-10-26: 2 mL via INTRA_ARTICULAR

## 2020-10-26 NOTE — Progress Notes (Signed)
Office Visit Note   Patient: Marie Ramirez           Date of Birth: 01-22-1946           MRN: [id number] Visit Date: 10/26/2020              Requested by: [name], MD [place],  [place] [address] PCP: [name], MD   Assessment & Plan: Visit Diagnoses:  1. Primary osteoarthritis of left knee     Plan: [name] has osteoarthritis of her left knee and has done well with cortisone in the past.  Her granddaughter is getting married in the next several weeks and is having recurrent symptoms of medial joint pain.  Will inject with cortisone and pre-CERT viscosupplementation  Follow-Up Instructions: Return Pre-CERT viscosupplementation.   Orders:  No orders of the defined types were placed in this encounter.  No orders of the defined types were placed in this encounter.     Procedures: Large Joint Inj: L knee on 10/26/2020 1:44 PM Indications: pain and diagnostic evaluation Details: 25 G 1.5 in needle, anteromedial approach  Arthrogram: No  Medications: 2 mL lidocaine 1 %; 2 mL bupivacaine 0.25 %  12 mg betamethasone injected with Marcaine and Xylocaine into the medial compartment left knee Procedure, treatment alternatives, risks and benefits explained, specific risks discussed. Consent was given by the patient. Patient was prepped and draped in the usual sterile fashion.      Clinical Data: No additional findings.   Subjective: Chief Complaint  Patient presents with   Left Knee - Pain  Patient presents today for recurrent left knee pain. She said that she received a cortisone injection in July. She had good results from that injection and would like to get another one today. Her granddaughter is getting married next week and wants to make sure she can ambulate well. She takes Tylenol if needed.   HPI  Review of Systems   Objective: Vital Signs: There were no vitals taken for this visit.  Physical Exam Constitutional:       Appearance: She is well-developed.  Eyes:     Pupils: Pupils are equal, round, and reactive to light.  Pulmonary:     Effort: Pulmonary effort is normal.  Skin:    General: Skin is warm and dry.  Neurological:     Mental Status: She is alert and oriented to person, place, and time.  Psychiatric:        Behavior: Behavior normal.    Ortho Exam left knee with mild medial joint pain.  None laterally.  Some patella crepitation but no pain with patella compression.  Full extension and flex to least 95 degrees without instability.  No obvious effusion  Specialty Comments:  No specialty comments available.  Imaging: No results found.   PMFS History: Patient Active Problem List   Diagnosis Date Noted   CKD (chronic kidney disease), stage III ([place]) 12/25/2017   Carpal tunnel syndrome, left upper limb 10/16/2017   Hyperlipidemia 04/04/2016   Osteoarthritis of left knee 02/20/2016   GERD (gastroesophageal reflux disease) 05/09/2015   Osteopenia 03/15/2014   Vaginitis, atrophic 02/24/2014   Obesity 05/31/2011   Hematuria 12/17/2006   Essential hypertension 09/06/2006   Allergic rhinitis 09/06/2006   Past Medical History:  Diagnosis Date   Allergy    Arthritis    Carpal tunnel syndrome of left wrist    Carpal tunnel syndrome of right wrist    Chronic left  shoulder pain 05/31/2011   Frozen shoulder history- did exercises and eventually resolved   Hypertension    Lichen planus 86/75/4492   Qualifier: Diagnosis of  By: Sherren Mocha MD, Jory Ee     Family History  Problem Relation Age of Onset   Alzheimer's disease Mother    Leukemia Father    CVA Father    Hypertension Father    Hypertension Sister    Plantar fasciitis Daughter    Diverticulitis Son    Lymphoma Maternal Grandmother    Early death Maternal Grandfather    Stroke Paternal Grandmother    Dementia Paternal Grandmother    Stroke Paternal Grandfather    Hypertension Paternal Grandfather    Colon cancer Neg Hx      Past Surgical History:  Procedure Laterality Date   CARPAL TUNNEL RELEASE     rt   CATARACT EXTRACTION W/ INTRAOCULAR LENS  IMPLANT, BILATERAL     PARATHYROIDECTOMY  2004   1 gland removed   PLANTAR FASCIA SURGERY  2005   left   TONSILLECTOMY     VAGINAL HYSTERECTOMY     Social History   Occupational History   Occupation: retired   Tobacco Use   Smoking status: Never   Smokeless tobacco: Never  Vaping Use   Vaping Use: Never used  Substance and Sexual Activity   Alcohol use: No   Drug use: No   Sexual activity: Yes

## 2020-11-08 DIAGNOSIS — Z1231 Encounter for screening mammogram for malignant neoplasm of breast: Secondary | ICD-10-CM | POA: Diagnosis not present

## 2020-11-08 LAB — HM MAMMOGRAPHY

## 2020-11-09 ENCOUNTER — Other Ambulatory Visit: Payer: Self-pay

## 2020-11-09 ENCOUNTER — Ambulatory Visit (INDEPENDENT_AMBULATORY_CARE_PROVIDER_SITE_OTHER): Payer: Medicare Other | Admitting: Orthopaedic Surgery

## 2020-11-09 ENCOUNTER — Encounter: Payer: Self-pay | Admitting: Orthopaedic Surgery

## 2020-11-09 DIAGNOSIS — M1712 Unilateral primary osteoarthritis, left knee: Secondary | ICD-10-CM | POA: Diagnosis not present

## 2020-11-09 MED ORDER — HYLAN G-F 20 16 MG/2ML IX SOSY
16.0000 mg | PREFILLED_SYRINGE | INTRA_ARTICULAR | Status: AC | PRN
  Administered 2020-11-09: 16 mg via INTRA_ARTICULAR

## 2020-11-09 NOTE — Progress Notes (Signed)
Office Visit Note   Patient: Marie Ramirez           Date of Birth: 13-Aug-1945           MRN: [id number] Visit Date: 11/09/2020              Requested by: [name], MD [place],  [place] [address] PCP: [name], MD   Assessment & Plan: Visit Diagnoses:  1. Primary osteoarthritis of left knee     Plan: First Synvisc injection left knee today.  Return weekly for the next 2 weeks to complete the series of 3  Follow-Up Instructions: Return in about 1 week (around 11/16/2020).   Orders:  No orders of the defined types were placed in this encounter.  No orders of the defined types were placed in this encounter.     Procedures: Large Joint Inj: L knee on 11/09/2020 11:22 AM Indications: pain and joint swelling Details: 25 G 1.5 in needle, anteromedial approach  Arthrogram: No  Medications: 16 mg Hylan 16 MG/2ML Outcome: tolerated well, no immediate complications Procedure, treatment alternatives, risks and benefits explained, specific risks discussed. Consent was given by the patient. Immediately prior to procedure a time out was called to verify the correct patient, procedure, equipment, support staff and site/side marked as required. Patient was prepped and draped in the usual sterile fashion.      Clinical Data: No additional findings.   Subjective: Chief Complaint  Patient presents with   Left Knee - Follow-up    Synvisc started 11/09/2020  Patient presents today for the first Synvisc injection in her left knee.  HPI  Review of Systems   Objective: Vital Signs: There were no vitals taken for this visit.  Physical Exam  Ortho Exam left knee was not hot red warm or swollen.  No effusion.  Predominately medial joint pain  Specialty Comments:  No specialty comments available.  Imaging: No results found.   PMFS History: Patient Active Problem List   Diagnosis Date Noted   CKD (chronic kidney disease), stage III ([place])  12/25/2017   Carpal tunnel syndrome, left upper limb 10/16/2017   Hyperlipidemia 04/04/2016   Osteoarthritis of left knee 02/20/2016   GERD (gastroesophageal reflux disease) 05/09/2015   Osteopenia 03/15/2014   Vaginitis, atrophic 02/24/2014   Obesity 05/31/2011   Hematuria 12/17/2006   Essential hypertension 09/06/2006   Allergic rhinitis 09/06/2006   Past Medical History:  Diagnosis Date   Allergy    Arthritis    Carpal tunnel syndrome of left wrist    Carpal tunnel syndrome of right wrist    Chronic left shoulder pain 05/31/2011   Frozen shoulder history- did exercises and eventually resolved   Hypertension    Lichen planus 93/73/4287   Qualifier: Diagnosis of  By: Sherren Mocha MD, Jory Ee     Family History  Problem Relation Age of Onset   Alzheimer's disease Mother    Leukemia Father    CVA Father    Hypertension Father    Hypertension Sister    Plantar fasciitis Daughter    Diverticulitis Son    Lymphoma Maternal Grandmother    Early death Maternal Grandfather    Stroke Paternal Grandmother    Dementia Paternal Grandmother    Stroke Paternal Grandfather    Hypertension Paternal Grandfather    Colon cancer Neg Hx     Past Surgical History:  Procedure Laterality Date   CARPAL TUNNEL RELEASE     rt  CATARACT EXTRACTION W/ INTRAOCULAR LENS  IMPLANT, BILATERAL     PARATHYROIDECTOMY  2004   1 gland removed   PLANTAR FASCIA SURGERY  2005   left   TONSILLECTOMY     VAGINAL HYSTERECTOMY     Social History   Occupational History   Occupation: retired   Tobacco Use   Smoking status: Never   Smokeless tobacco: Never  Vaping Use   Vaping Use: Never used  Substance and Sexual Activity   Alcohol use: No   Drug use: No   Sexual activity: Yes

## 2020-11-14 ENCOUNTER — Encounter: Payer: Self-pay | Admitting: Family Medicine

## 2020-11-16 ENCOUNTER — Other Ambulatory Visit: Payer: Self-pay

## 2020-11-16 ENCOUNTER — Ambulatory Visit: Payer: Medicare Other | Admitting: Orthopaedic Surgery

## 2020-11-16 ENCOUNTER — Encounter: Payer: Self-pay | Admitting: Orthopaedic Surgery

## 2020-11-16 DIAGNOSIS — M1712 Unilateral primary osteoarthritis, left knee: Secondary | ICD-10-CM | POA: Diagnosis not present

## 2020-11-16 MED ORDER — HYLAN G-F 20 16 MG/2ML IX SOSY
16.0000 mg | PREFILLED_SYRINGE | INTRA_ARTICULAR | Status: AC | PRN
  Administered 2020-11-16: 16 mg via INTRA_ARTICULAR

## 2020-11-16 NOTE — Progress Notes (Signed)
Office Visit Note   Patient: Marie Ramirez           Date of Birth: Nov 11, 1945           MRN: [id number] Visit Date: 11/16/2020              Requested by: [name], MD [place],  [place] [address] PCP: [name], MD   Assessment & Plan: Visit Diagnoses:  1. Primary osteoarthritis of left knee     Plan: Second Synvisc injection left knee.  Actually did quite well after the first and having fewer symptoms.  Return next week for the third and final injection  Follow-Up Instructions: Return in about 1 week (around 11/23/2020).   Orders:  Orders Placed This Encounter  Procedures   Large Joint Inj: L knee   No orders of the defined types were placed in this encounter.     Procedures: Large Joint Inj: L knee on 11/16/2020 10:23 AM Indications: pain and joint swelling Details: 25 G 1.5 in needle, anteromedial approach  Arthrogram: No  Medications: 16 mg Hylan 16 MG/2ML Outcome: tolerated well, no immediate complications Procedure, treatment alternatives, risks and benefits explained, specific risks discussed. Consent was given by the patient. Immediately prior to procedure a time out was called to verify the correct patient, procedure, equipment, support staff and site/side marked as required. Patient was prepped and draped in the usual sterile fashion.      Clinical Data: No additional findings.   Subjective: Chief Complaint  Patient presents with   Left Knee - Follow-up    Synvisc   Patient presents today for the second Synvisc injection in her left knee.   HPI  Review of Systems   Objective: Vital Signs: There were no vitals taken for this visit.  Physical Exam  Ortho Exam left knee was not hot warm red or swollen.  No effusion and no significant medial or lateral joint pain.  Walks without use of an ambulatory aid  Specialty Comments:  No specialty comments available.  Imaging: No results found.   PMFS  History: Patient Active Problem List   Diagnosis Date Noted   CKD (chronic kidney disease), stage III ([place]) 12/25/2017   Carpal tunnel syndrome, left upper limb 10/16/2017   Hyperlipidemia 04/04/2016   Osteoarthritis of left knee 02/20/2016   GERD (gastroesophageal reflux disease) 05/09/2015   Osteopenia 03/15/2014   Vaginitis, atrophic 02/24/2014   Obesity 05/31/2011   Hematuria 12/17/2006   Essential hypertension 09/06/2006   Allergic rhinitis 09/06/2006   Past Medical History:  Diagnosis Date   Allergy    Arthritis    Carpal tunnel syndrome of left wrist    Carpal tunnel syndrome of right wrist    Chronic left shoulder pain 05/31/2011   Frozen shoulder history- did exercises and eventually resolved   Hypertension    Lichen planus 72/53/6644   Qualifier: Diagnosis of  By: Sherren Mocha MD, Jory Ee     Family History  Problem Relation Age of Onset   Alzheimer's disease Mother    Leukemia Father    CVA Father    Hypertension Father    Hypertension Sister    Plantar fasciitis Daughter    Diverticulitis Son    Lymphoma Maternal Grandmother    Early death Maternal Grandfather    Stroke Paternal Grandmother    Dementia Paternal Grandmother    Stroke Paternal Grandfather    Hypertension Paternal Grandfather    Colon cancer Neg Hx  Past Surgical History:  Procedure Laterality Date   CARPAL TUNNEL RELEASE     rt   CATARACT EXTRACTION W/ INTRAOCULAR LENS  IMPLANT, BILATERAL     PARATHYROIDECTOMY  2004   1 gland removed   PLANTAR FASCIA SURGERY  2005   left   TONSILLECTOMY     VAGINAL HYSTERECTOMY     Social History   Occupational History   Occupation: retired   Tobacco Use   Smoking status: Never   Smokeless tobacco: Never  Vaping Use   Vaping Use: Never used  Substance and Sexual Activity   Alcohol use: No   Drug use: No   Sexual activity: Yes

## 2020-11-23 ENCOUNTER — Other Ambulatory Visit: Payer: Self-pay

## 2020-11-23 ENCOUNTER — Ambulatory Visit (INDEPENDENT_AMBULATORY_CARE_PROVIDER_SITE_OTHER): Payer: Medicare Other | Admitting: Orthopaedic Surgery

## 2020-11-23 ENCOUNTER — Encounter: Payer: Self-pay | Admitting: Orthopaedic Surgery

## 2020-11-23 DIAGNOSIS — M1712 Unilateral primary osteoarthritis, left knee: Secondary | ICD-10-CM

## 2020-11-23 MED ORDER — HYLAN G-F 20 16 MG/2ML IX SOSY
16.0000 mg | PREFILLED_SYRINGE | INTRA_ARTICULAR | Status: AC | PRN
  Administered 2020-11-23: 16 mg via INTRA_ARTICULAR

## 2020-11-23 NOTE — Progress Notes (Signed)
Office Visit Note   Patient: Marie Ramirez           Date of Birth: 04-03-1945           MRN: [id number] Visit Date: 11/23/2020              Requested by: [name], MD [place],  [place] [id number] PCP: [name], MD   Assessment & Plan: Visit Diagnoses:  1. Primary osteoarthritis of left knee     Plan: Third and final Synvisc injection left knee.  Doing quite well and relates having little if any discomfort.  The injections have made a big difference  Follow-Up Instructions: Return if symptoms worsen or fail to improve.   Orders:  No orders of the defined types were placed in this encounter.  No orders of the defined types were placed in this encounter.     Procedures: Large Joint Inj: L knee on 11/23/2020 10:44 AM Indications: pain and joint swelling Details: 25 G 1.5 in needle, anteromedial approach  Arthrogram: No  Medications: 16 mg Hylan 16 MG/2ML Outcome: tolerated well, no immediate complications Procedure, treatment alternatives, risks and benefits explained, specific risks discussed. Consent was given by the patient. Immediately prior to procedure a time out was called to verify the correct patient, procedure, equipment, support staff and site/side marked as required. Patient was prepped and draped in the usual sterile fashion.      Clinical Data: No additional findings.   Subjective: Chief Complaint  Patient presents with   Left Knee - Follow-up  Patient presents today for the third Synvisc injection in her left knee.  HPI  Review of Systems   Objective: Vital Signs: There were no vitals taken for this visit.  Physical Exam  Ortho Exam left knee was not hot warm red.  No effusion.  No localized tenderness.  No pain with patella flexion extension and no instability  Specialty Comments:  No specialty comments available.  Imaging: No results found.   PMFS History: Patient Active Problem List   Diagnosis  Date Noted   CKD (chronic kidney disease), stage III ([place]) 12/25/2017   Carpal tunnel syndrome, left upper limb 10/16/2017   Hyperlipidemia 04/04/2016   Osteoarthritis of left knee 02/20/2016   GERD (gastroesophageal reflux disease) 05/09/2015   Osteopenia 03/15/2014   Vaginitis, atrophic 02/24/2014   Obesity 05/31/2011   Hematuria 12/17/2006   Essential hypertension 09/06/2006   Allergic rhinitis 09/06/2006   Past Medical History:  Diagnosis Date   Allergy    Arthritis    Carpal tunnel syndrome of left wrist    Carpal tunnel syndrome of right wrist    Chronic left shoulder pain 05/31/2011   Frozen shoulder history- did exercises and eventually resolved   Hypertension    Lichen planus 43/32/9518   Qualifier: Diagnosis of  By: Sherren Mocha MD, Jory Ee     Family History  Problem Relation Age of Onset   Alzheimer's disease Mother    Leukemia Father    CVA Father    Hypertension Father    Hypertension Sister    Plantar fasciitis Daughter    Diverticulitis Son    Lymphoma Maternal Grandmother    Early death Maternal Grandfather    Stroke Paternal Grandmother    Dementia Paternal Grandmother    Stroke Paternal Grandfather    Hypertension Paternal Grandfather    Colon cancer Neg Hx     Past Surgical History:  Procedure Laterality Date  CARPAL TUNNEL RELEASE     rt   CATARACT EXTRACTION W/ INTRAOCULAR LENS  IMPLANT, BILATERAL     PARATHYROIDECTOMY  2004   1 gland removed   PLANTAR FASCIA SURGERY  2005   left   TONSILLECTOMY     VAGINAL HYSTERECTOMY     Social History   Occupational History   Occupation: retired   Tobacco Use   Smoking status: Never   Smokeless tobacco: Never  Vaping Use   Vaping Use: Never used  Substance and Sexual Activity   Alcohol use: No   Drug use: No   Sexual activity: Yes

## 2021-01-25 DIAGNOSIS — H04123 Dry eye syndrome of bilateral lacrimal glands: Secondary | ICD-10-CM | POA: Diagnosis not present

## 2021-02-03 ENCOUNTER — Other Ambulatory Visit: Payer: Self-pay

## 2021-02-03 ENCOUNTER — Ambulatory Visit (INDEPENDENT_AMBULATORY_CARE_PROVIDER_SITE_OTHER): Payer: Medicare Other

## 2021-02-03 DIAGNOSIS — Z0000 Encounter for general adult medical examination without abnormal findings: Secondary | ICD-10-CM | POA: Diagnosis not present

## 2021-02-03 NOTE — Progress Notes (Signed)
Virtual Visit via Telephone Note  I connected with  Marie Ramirez on 02/03/21 at  1:00 PM EST by telephone and verified that I am speaking with the correct person using two identifiers.  Medicare Annual Wellness visit completed telephonically due to Covid-19 pandemic.   Persons participating in this call: This Health Coach and this patient.   Location: Patient: Home Provider: Office    I discussed the limitations, risks, security and privacy concerns of performing an evaluation and management service by telephone and the availability of in person appointments. The patient expressed understanding and agreed to proceed.  Unable to perform video visit due to video visit attempted and failed and/or patient does not have video capability.   Some vital signs may be absent or patient reported.   Willette Brace, LPN   Subjective:   Marie Ramirez is a 76 y.o. female who presents for Medicare Annual (Subsequent) preventive examination.  Review of Systems     Cardiac Risk Factors include: advanced age (>28men, >39 women);dyslipidemia;hypertension     Objective:    There were no vitals filed for this visit. There is no height or weight on file to calculate BMI.  Advanced Directives 02/03/2021 01/29/2020 12/31/2017 04/02/2016 06/02/2015  Does Patient Have a Medical Advance Directive? Yes Yes No No No  Type of Advance Directive - Healthcare Power of [place];Living will - - -  Does patient want to make changes to medical advance directive? Yes (MAU/Ambulatory/Procedural Areas - Information given) - - - -  Copy of Healthcare Power of Attorney in Chart? - No - copy requested - - -  Would patient like information on creating a medical advance directive? - - No - Patient declined - -    Current Medications (verified) Outpatient Encounter Medications as of 02/03/2021  Medication Sig   atenolol-chlorthalidone (TENORETIC) 50-25 MG tablet Take half tablet daily   atorvastatin (LIPITOR) 20 MG tablet  TAKE 1 TABLET(20 MG) BY MOUTH 1 TIME A WEEK   calcium citrate-vitamin D (CITRACAL+D) 315-200 MG-UNIT per tablet Take 1 tablet by mouth daily.   famotidine (PEPCID) 20 MG tablet Take 1 tablet (20 mg total) by mouth 2 (two) times daily. (Patient taking differently: Take 20 mg by mouth daily.)   fexofenadine (ALLEGRA) 180 MG tablet Take 180 mg by mouth daily.   fluorometholone (FML) 0.1 % ophthalmic suspension SMARTSIG:In Eye(s)   fluticasone (FLONASE) 50 MCG/ACT nasal spray PLACE 2 SPRAYS IN EACH NOSTRIL DAILY   No facility-administered encounter medications on file as of 02/03/2021.    Allergies (verified) Penicillins   History: Past Medical History:  Diagnosis Date   Allergy    Arthritis    Carpal tunnel syndrome of left wrist    Carpal tunnel syndrome of right wrist    Chronic left shoulder pain 05/31/2011   Frozen shoulder history- did exercises and eventually resolved   Hypertension    Lichen planus 75/91/6384   Qualifier: Diagnosis of  By: Sherren Mocha MD, Jory Ee    Past Surgical History:  Procedure Laterality Date   CARPAL TUNNEL RELEASE     rt   CATARACT EXTRACTION W/ INTRAOCULAR LENS  IMPLANT, BILATERAL     PARATHYROIDECTOMY  2004   1 gland removed   PLANTAR FASCIA SURGERY  2005   left   TONSILLECTOMY     VAGINAL HYSTERECTOMY     Family History  Problem Relation Age of Onset   Alzheimer's disease Mother    Leukemia Father    CVA Father  Hypertension Father    Hypertension Sister    Plantar fasciitis Daughter    Diverticulitis Son    Lymphoma Maternal Grandmother    Early death Maternal Grandfather    Stroke Paternal Grandmother    Dementia Paternal Grandmother    Stroke Paternal Grandfather    Hypertension Paternal Grandfather    Colon cancer Neg Hx    Social History   Socioeconomic History   Marital status: Married    Spouse name: Not on file   Number of children: Not on file   Years of education: Not on file   Highest education level: Not on file   Occupational History   Occupation: retired   Tobacco Use   Smoking status: Never   Smokeless tobacco: Never  Vaping Use   Vaping Use: Never used  Substance and Sexual Activity   Alcohol use: No   Drug use: No   Sexual activity: Yes  Other Topics Concern   Not on file  Social History Narrative   Married. Husband Gwyndolyn Saxon. 2 kids by first marriage. 2 step kids. 7 grandkids.       Retired Loss adjuster, [employer]   Social Determinants of Radio broadcast assistant Strain: Low Risk    Difficulty of Paying Living Expenses: Not hard at all  Food Insecurity: No Food Insecurity   Worried About Charity fundraiser in the Last Year: Never true   Arboriculturist in the Last Year: Never true  Transportation Needs: No Transportation Needs   Lack of Transportation (Medical): No   Lack of Transportation (Non-Medical): No  Physical Activity: Insufficiently Active   Days of Exercise per Week: 5 days   Minutes of Exercise per Session: 20 min  Stress: No Stress Concern Present   Feeling of Stress : Not at all  Social Connections: Moderately Integrated   Frequency of Communication with Friends and Family: Twice a week   Frequency of Social Gatherings with Friends and Family: More than three times a week   Attends Religious Services: More than 4 times per year   Active Member of [employer] or Organizations: No   Attends Music therapist: Never   Marital Status: Married    Tobacco Counseling Counseling given: Not Answered   Clinical Intake:  Pre-visit preparation completed: Yes  Pain : No/denies pain     BMI - recorded: 25.12 Nutritional Status: BMI 25 -29 Overweight Nutritional Risks: None Diabetes: No  How often do you need to have someone help you when you read instructions, pamphlets, or other written materials from your doctor or pharmacy?: 1 - Never  Diabetic?no  Interpreter Needed?: No  Information entered by :: Charlott Rakes,  LPN   Activities of Daily Living In your present state of health, do you have any difficulty performing the following activities: 02/03/2021  Hearing? Y  Vision? N  Difficulty concentrating or making decisions? N  Walking or climbing stairs? N  Dressing or bathing? N  Doing errands, shopping? N  Preparing Food and eating ? N  Using the Toilet? N  In the past six months, have you accidently leaked urine? N  Do you have problems with loss of bowel control? N  Managing your Medications? N  Managing your Finances? N  Housekeeping or managing your Housekeeping? N  Some recent data might be hidden    Patient Care Team: [name], MD as PCP - General (Family Medicine) Garrel Ridgel, DPM as Consulting Physician (Podiatry) Durward Fortes,  Vonna Kotyk, MD as Consulting Physician (Orthopedic Surgery) [name] Kirke Corin, MD as Consulting Physician (Gastroenterology)  Indicate any recent Medical Services you may have received from other than [facility] providers in the past year (date may be approximate).     Assessment:   This is a routine wellness examination for Marie Ramirez.  Hearing/Vision screen Hearing Screening - Comments:: Pt stated slight loss  Vision Screening - Comments:: Pt follows up with Dr [name] for annual eye exams   Dietary issues and exercise activities discussed: Current Exercise Habits: Home exercise routine, Type of exercise: Other - see comments, Time (Minutes): 30, Frequency (Times/Week): 5, Weekly Exercise (Minutes/Week): 150   Goals Addressed             This Visit's Progress    Patient Stated       Stay well        Depression Screen PHQ 2/9 Scores 02/03/2021 07/27/2020 01/29/2020 07/08/2019 01/14/2019 01/14/2019 01/02/2019  PHQ - 2 Score 0 0 0 0 0 0 0  PHQ- 9 Score - - - 0 - 0 0    Fall Risk Fall Risk  02/03/2021 07/27/2020 01/29/2020 01/14/2019 01/02/2019  Falls in the past year? 0 0 0 0 0  Comment - - - - -  Number falls in past yr: 0 0 0 0 0  Comment - - - - -   Injury with Fall? 0 0 0 - 0  Risk for fall due to : Impaired vision No Fall Risks - - -  Follow up Falls prevention discussed Falls evaluation completed Falls prevention discussed - -    FALL RISK PREVENTION PERTAINING TO THE HOME:  Any stairs in or around the home? Yes  If so, are there any without handrails? No  Home free of loose throw rugs in walkways, pet beds, electrical cords, etc? Yes  Adequate lighting in your home to reduce risk of falls? Yes   ASSISTIVE DEVICES UTILIZED TO PREVENT FALLS:  Life alert? No  Use of a cane, walker or w/c? No  Grab bars in the bathroom? Yes  Shower chair or bench in shower? Yes  Elevated toilet seat or a handicapped toilet? No   TIMED UP AND GO:  Was the test performed? No .   Cognitive Function: MMSE - Mini Mental State Exam 04/02/2016  Not completed: (No Data)     6CIT Screen 02/03/2021 01/29/2020 12/31/2017  What Year? 0 points 0 points 0 points  What month? 0 points 0 points 0 points  What time? 0 points - 0 points  Count back from 20 0 points 0 points 0 points  Months in reverse 0 points 0 points 0 points  Repeat phrase 0 points 0 points 0 points  Total Score 0 - 0    Immunizations Immunization History  Administered Date(s) Administered   Fluad Quad(high Dose 65+) 11/21/2018, 10/27/2019   Influenza Split 10/22/2012   Influenza Whole 01/22/2005, 10/23/2006, 10/22/2008, 10/22/2009   Influenza-Unspecified 10/22/2013, 11/09/2015, 10/31/2017   Moderna Sars-Covid-2 Vaccination 02/18/2019, 03/18/2019   Pneumococcal Polysaccharide-23 12/02/2007   Td 01/22/2001   Tetanus 02/17/2013    TDAP status: Up to date  Flu Vaccine status: Declined, Education has been provided regarding the importance of this vaccine but patient still declined. Advised may receive this vaccine at local pharmacy or Health Dept. Aware to provide a copy of the vaccination record if obtained from local pharmacy or Health Dept. Verbalized acceptance and  understanding.  Pneumococcal vaccine status: Declined,  Education has been  provided regarding the importance of this vaccine but patient still declined. Advised may receive this vaccine at local pharmacy or Health Dept. Aware to provide a copy of the vaccination record if obtained from local pharmacy or Health Dept. Verbalized acceptance and understanding.   Covid-19 vaccine status: Completed vaccines  Qualifies for Shingles Vaccine? Yes   Zostavax completed No   Shingrix Completed?: No.    Education has been provided regarding the importance of this vaccine. Patient has been advised to call insurance company to determine out of pocket expense if they have not yet received this vaccine. Advised may also receive vaccine at local pharmacy or Health Dept. Verbalized acceptance and understanding.  Screening Tests Health Maintenance  Topic Date Due   Zoster Vaccines- Shingrix (1 of 2) Never done   COVID-19 Vaccine (3 - Booster for Moderna series) 02/19/2021 (Originally 05/13/2019)   INFLUENZA VACCINE  04/21/2021 (Originally 08/22/2020)   Pneumonia Vaccine 52+ Years old (2 - PCV) 02/03/2022 (Originally 12/01/2008)   MAMMOGRAM  11/09/2022   TETANUS/TDAP  02/18/2023   COLONOSCOPY (Pts 45-108yrs Insurance coverage will need to be confirmed)  03/05/2023   DEXA SCAN  Completed   Hepatitis C Screening  Completed   HPV VACCINES  Aged Out    Health Maintenance  Health Maintenance Due  Topic Date Due   Zoster Vaccines- Shingrix (1 of 2) Never done    Colorectal cancer screening: Type of screening: Colonoscopy. Completed 03/04/13. Repeat every 10 years  Mammogram status: Completed 11/08/20. Repeat every year  Bone Density status: Completed 04/07/18. Results reflect: Bone density results: OSTEOPENIA. Repeat every 2 years.   Additional Screening:  Hepatitis C Screening:  Completed 04/02/16  Vision Screening: Recommended annual ophthalmology exams for early detection of glaucoma and other disorders  of the eye. Is the patient up to date with their annual eye exam?  Yes  Who is the provider or what is the name of the office in which the patient attends annual eye exams? Dr [name]  If pt is not established with a provider, would they like to be referred to a provider to establish care? No .   Dental Screening: Recommended annual dental exams for proper oral hygiene  Community Resource Referral / Chronic Care Management: CRR required this visit?  No   CCM required this visit?  No      Plan:     I have personally reviewed and noted the following in the patients chart:   Medical and social history Use of alcohol, tobacco or illicit drugs  Current medications and supplements including opioid prescriptions.  Functional ability and status Nutritional status Physical activity Advanced directives List of other physicians Hospitalizations, surgeries, and ER visits in previous 12 months Vitals Screenings to include cognitive, depression, and falls Referrals and appointments  In addition, I have reviewed and discussed with patient certain preventive protocols, quality metrics, and best practice recommendations. A written personalized care plan for preventive services as well as general preventive health recommendations were provided to patient.     Willette Brace, LPN   [id number]/[id number]   Nurse Notes: None

## 2021-02-03 NOTE — Patient Instructions (Signed)
Marie Ramirez , Thank you for taking time to come for your Medicare Wellness Visit. I appreciate your ongoing commitment to your health goals. Please review the following plan we discussed and let me know if I can assist you in the future.   Screening recommendations/referrals: Colonoscopy: Done 03/04/13 repeat every 10 years  Mammogram: Done 11/08/20 repeat every year  Bone Density: Done 04/07/18 repeat every 2 years  Recommended yearly ophthalmology/optometry visit for glaucoma screening and checkup Recommended yearly dental visit for hygiene and checkup  Vaccinations: Influenza vaccine: Done  Pneumococcal vaccine: Declined Tdap vaccine: Done 02/17/13 repeat every 10 years  Shingles vaccine: Shingrix discussed. Please contact your pharmacy for coverage information.    Covid-19:Completed 1/27, 03/18/19  Advanced directives: Please bring a copy of your health care power of attorney and living will to the office at your convenience.  Conditions/risks identified: Stay Well  Next appointment: Follow up in one year for your annual wellness visit    Preventive Care 65 Years and Older, Female Preventive care refers to lifestyle choices and visits with your health care provider that can promote health and wellness. What does preventive care include? A yearly physical exam. This is also called an annual well check. Dental exams once or twice a year. Routine eye exams. Ask your health care provider how often you should have your eyes checked. Personal lifestyle choices, including: Daily care of your teeth and gums. Regular physical activity. Eating a healthy diet. Avoiding tobacco and drug use. Limiting alcohol use. Practicing safe sex. Taking low-dose aspirin every day. Taking vitamin and mineral supplements as recommended by your health care provider. What happens during an annual well check? The services and screenings done by your health care provider during your annual well check will  depend on your age, overall health, lifestyle risk factors, and family history of disease. Counseling  Your health care provider may ask you questions about your: Alcohol use. Tobacco use. Drug use. Emotional well-being. Home and relationship well-being. Sexual activity. Eating habits. History of falls. Memory and ability to understand (cognition). Work and work Statistician. Reproductive health. Screening  You may have the following tests or measurements: Height, weight, and BMI. Blood pressure. Lipid and cholesterol levels. These may be checked every 5 years, or more frequently if you are over 31 years old. Skin check. Lung cancer screening. You may have this screening every year starting at age 57 if you have a 30-pack-year history of smoking and currently smoke or have quit within the past 15 years. Fecal occult blood test (FOBT) of the stool. You may have this test every year starting at age 25. Flexible sigmoidoscopy or colonoscopy. You may have a sigmoidoscopy every 5 years or a colonoscopy every 10 years starting at age 46. Hepatitis C blood test. Hepatitis B blood test. Sexually transmitted disease (STD) testing. Diabetes screening. This is done by checking your blood sugar (glucose) after you have not eaten for a while (fasting). You may have this done every 1-3 years. Bone density scan. This is done to screen for osteoporosis. You may have this done starting at age 71. Mammogram. This may be done every 1-2 years. Talk to your health care provider about how often you should have regular mammograms. Talk with your health care provider about your test results, treatment options, and if necessary, the need for more tests. Vaccines  Your health care provider may recommend certain vaccines, such as: Influenza vaccine. This is recommended every year. Tetanus, diphtheria, and acellular pertussis (Tdap,  Td) vaccine. You may need a Td booster every 10 years. Zoster vaccine. You may  need this after age 34. Pneumococcal 13-valent conjugate (PCV13) vaccine. One dose is recommended after age 52. Pneumococcal polysaccharide (PPSV23) vaccine. One dose is recommended after age 45. Talk to your health care provider about which screenings and vaccines you need and how often you need them. This information is not intended to replace advice given to you by your health care provider. Make sure you discuss any questions you have with your health care provider. Document Released: 02/04/2015 Document Revised: 09/28/2015 Document Reviewed: 11/09/2014 Elsevier Interactive Patient Education  2017 [place] Prevention in the Home Falls can cause injuries. They can happen to people of all ages. There are many things you can do to make your home safe and to help prevent falls. What can I do on the outside of my home? Regularly fix the edges of walkways and driveways and fix any cracks. Remove anything that might make you trip as you walk through a door, such as a raised step or threshold. Trim any bushes or trees on the path to your home. Use bright outdoor lighting. Clear any walking paths of anything that might make someone trip, such as rocks or tools. Regularly check to see if handrails are loose or broken. Make sure that both sides of any steps have handrails. Any raised decks and porches should have guardrails on the edges. Have any leaves, snow, or ice cleared regularly. Use sand or salt on walking paths during winter. Clean up any spills in your garage right away. This includes oil or grease spills. What can I do in the bathroom? Use night lights. Install grab bars by the toilet and in the tub and shower. Do not use towel bars as grab bars. Use non-skid mats or decals in the tub or shower. If you need to sit down in the shower, use a plastic, non-slip stool. Keep the floor dry. Clean up any water that spills on the floor as soon as it happens. Remove soap buildup in  the tub or shower regularly. Attach bath mats securely with double-sided non-slip rug tape. Do not have throw rugs and other things on the floor that can make you trip. What can I do in the bedroom? Use night lights. Make sure that you have a light by your bed that is easy to reach. Do not use any sheets or blankets that are too big for your bed. They should not hang down onto the floor. Have a firm chair that has side arms. You can use this for support while you get dressed. Do not have throw rugs and other things on the floor that can make you trip. What can I do in the kitchen? Clean up any spills right away. Avoid walking on wet floors. Keep items that you use a lot in easy-to-reach places. If you need to reach something above you, use a strong step stool that has a grab bar. Keep electrical cords out of the way. Do not use floor polish or wax that makes floors slippery. If you must use wax, use non-skid floor wax. Do not have throw rugs and other things on the floor that can make you trip. What can I do with my stairs? Do not leave any items on the stairs. Make sure that there are handrails on both sides of the stairs and use them. Fix handrails that are broken or loose. Make sure that handrails are as  long as the stairways. Check any carpeting to make sure that it is firmly attached to the stairs. Fix any carpet that is loose or worn. Avoid having throw rugs at the top or bottom of the stairs. If you do have throw rugs, attach them to the floor with carpet tape. Make sure that you have a light switch at the top of the stairs and the bottom of the stairs. If you do not have them, ask someone to add them for you. What else can I do to help prevent falls? Wear shoes that: Do not have high heels. Have rubber bottoms. Are comfortable and fit you well. Are closed at the toe. Do not wear sandals. If you use a stepladder: Make sure that it is fully opened. Do not climb a closed  stepladder. Make sure that both sides of the stepladder are locked into place. Ask someone to hold it for you, if possible. Clearly mark and make sure that you can see: Any grab bars or handrails. First and last steps. Where the edge of each step is. Use tools that help you move around (mobility aids) if they are needed. These include: Canes. Walkers. Scooters. Crutches. Turn on the lights when you go into a dark area. Replace any light bulbs as soon as they burn out. Set up your furniture so you have a clear path. Avoid moving your furniture around. If any of your floors are uneven, fix them. If there are any pets around you, be aware of where they are. Review your medicines with your doctor. Some medicines can make you feel dizzy. This can increase your chance of falling. Ask your doctor what other things that you can do to help prevent falls. This information is not intended to replace advice given to you by your health care provider. Make sure you discuss any questions you have with your health care provider. Document Released: 11/04/2008 Document Revised: 06/16/2015 Document Reviewed: 02/12/2014 Elsevier Interactive Patient Education  2017 [employer].

## 2021-02-07 ENCOUNTER — Other Ambulatory Visit: Payer: Self-pay | Admitting: Family Medicine

## 2021-02-09 ENCOUNTER — Ambulatory Visit (INDEPENDENT_AMBULATORY_CARE_PROVIDER_SITE_OTHER): Payer: Medicare Other | Admitting: Family Medicine

## 2021-02-09 ENCOUNTER — Encounter: Payer: Self-pay | Admitting: Family Medicine

## 2021-02-09 ENCOUNTER — Other Ambulatory Visit: Payer: Self-pay

## 2021-02-09 VITALS — BP 128/80 | HR 56 | Temp 97.2°F | Ht 63.0 in | Wt 180.2 lb

## 2021-02-09 DIAGNOSIS — M1712 Unilateral primary osteoarthritis, left knee: Secondary | ICD-10-CM | POA: Diagnosis not present

## 2021-02-09 DIAGNOSIS — Z79899 Other long term (current) drug therapy: Secondary | ICD-10-CM

## 2021-02-09 DIAGNOSIS — E785 Hyperlipidemia, unspecified: Secondary | ICD-10-CM | POA: Diagnosis not present

## 2021-02-09 DIAGNOSIS — N183 Chronic kidney disease, stage 3 unspecified: Secondary | ICD-10-CM | POA: Diagnosis not present

## 2021-02-09 DIAGNOSIS — M8588 Other specified disorders of bone density and structure, other site: Secondary | ICD-10-CM

## 2021-02-09 DIAGNOSIS — Z0000 Encounter for general adult medical examination without abnormal findings: Secondary | ICD-10-CM

## 2021-02-09 DIAGNOSIS — I10 Essential (primary) hypertension: Secondary | ICD-10-CM

## 2021-02-09 LAB — CBC WITH DIFFERENTIAL/PLATELET
Basophils Absolute: 0.1 10*3/uL (ref 0.0–0.1)
Basophils Relative: 1 % (ref 0.0–3.0)
Eosinophils Absolute: 0.1 10*3/uL (ref 0.0–0.7)
Eosinophils Relative: 1.8 % (ref 0.0–5.0)
HCT: 45.7 % (ref 36.0–46.0)
Hemoglobin: 15 g/dL (ref 12.0–15.0)
Lymphocytes Relative: 19.6 % (ref 12.0–46.0)
Lymphs Abs: 1.3 10*3/uL (ref 0.7–4.0)
MCHC: 32.9 g/dL (ref 30.0–36.0)
MCV: 90.7 fl (ref 78.0–100.0)
Monocytes Absolute: 0.5 10*3/uL (ref 0.1–1.0)
Monocytes Relative: 7.9 % (ref 3.0–12.0)
Neutro Abs: 4.6 10*3/uL (ref 1.4–7.7)
Neutrophils Relative %: 69.7 % (ref 43.0–77.0)
Platelets: 338 10*3/uL (ref 150.0–400.0)
RBC: 5.04 Mil/uL (ref 3.87–5.11)
RDW: 12.7 % (ref 11.5–15.5)
WBC: 6.6 10*3/uL (ref 4.0–10.5)

## 2021-02-09 LAB — COMPREHENSIVE METABOLIC PANEL
ALT: 17 U/L (ref 0–35)
AST: 21 U/L (ref 0–37)
Albumin: 4.3 g/dL (ref 3.5–5.2)
Alkaline Phosphatase: 60 U/L (ref 39–117)
BUN: 30 mg/dL — ABNORMAL HIGH (ref 6–23)
CO2: 29 mEq/L (ref 19–32)
Calcium: 10.4 mg/dL (ref 8.4–10.5)
Chloride: 105 mEq/L (ref 96–112)
Creatinine, Ser: 1.32 mg/dL — ABNORMAL HIGH (ref 0.40–1.20)
GFR: 39.89 mL/min — ABNORMAL LOW (ref >60.00)
Glucose, Bld: 96 mg/dL (ref 70–99)
Potassium: 4.1 mEq/L (ref 3.5–5.1)
Sodium: 144 mEq/L (ref 135–145)
Total Bilirubin: 0.7 mg/dL (ref 0.2–1.2)
Total Protein: 7.7 g/dL (ref 6.0–8.3)

## 2021-02-09 LAB — LIPID PANEL
Cholesterol: 207 mg/dL — ABNORMAL HIGH (ref 0–200)
HDL: 54.4 mg/dL (ref >39.00)
LDL Cholesterol: 129 mg/dL — ABNORMAL HIGH (ref 0–99)
NonHDL: 152.9
Total CHOL/HDL Ratio: 4
Triglycerides: 122 mg/dL (ref 0.0–149.0)
VLDL: 24.4 mg/dL (ref 0.0–40.0)

## 2021-02-09 LAB — VITAMIN D 25 HYDROXY (VIT D DEFICIENCY, FRACTURES): VITD: 35.6 ng/mL (ref 30.00–100.00)

## 2021-02-09 NOTE — Progress Notes (Signed)
Phone [phone]   Subjective:  Patient presents today for their annual physical. Chief complaint-noted.   See problem oriented charting- ROS- full  review of systems was completed and negative per full ROS sheet  The following were reviewed and entered/updated in epic: Past Medical History:  Diagnosis Date   Allergy    Arthritis    Carpal tunnel syndrome of left wrist    Carpal tunnel syndrome of right wrist    Chronic left shoulder pain 05/31/2011   Frozen shoulder history- did exercises and eventually resolved   Hypertension    Lichen planus 70/62/3762   Qualifier: Diagnosis of  By: Sherren Mocha MD, Jory Ee    Patient Active Problem List   Diagnosis Date Noted   CKD (chronic kidney disease), stage III ([place]) 12/25/2017    Priority: Medium    Hyperlipidemia 04/04/2016    Priority: Medium    GERD (gastroesophageal reflux disease) 05/09/2015    Priority: Medium    Vaginitis, atrophic 02/24/2014    Priority: Medium    Hematuria 12/17/2006    Priority: Medium    Essential hypertension 09/06/2006    Priority: Medium    Carpal tunnel syndrome, left upper limb 10/16/2017    Priority: Low   Osteoarthritis of left knee 02/20/2016    Priority: Low   Osteopenia 03/15/2014    Priority: Low   Obesity 05/31/2011    Priority: Low   Allergic rhinitis 09/06/2006    Priority: Low   Past Surgical History:  Procedure Laterality Date   CARPAL TUNNEL RELEASE     rt   CATARACT EXTRACTION W/ INTRAOCULAR LENS  IMPLANT, BILATERAL     PARATHYROIDECTOMY  2004   1 gland removed   PLANTAR FASCIA SURGERY  2005   left   TONSILLECTOMY     VAGINAL HYSTERECTOMY      Family History  Problem Relation Age of Onset   Alzheimer's disease Mother    Leukemia Father    CVA Father    Hypertension Father    Hypertension Sister    Plantar fasciitis Daughter    Diverticulitis Son    Lymphoma Maternal Grandmother    Early death Maternal Grandfather    Stroke Paternal Grandmother    Dementia  Paternal Grandmother    Stroke Paternal Grandfather    Hypertension Paternal Grandfather    Colon cancer Neg Hx     Medications- reviewed and updated Current Outpatient Medications  Medication Sig Dispense Refill   atenolol-chlorthalidone (TENORETIC) 50-25 MG tablet TAKE 1/2 TABLET BY MOUTH DAILY 46 tablet 3   atorvastatin (LIPITOR) 20 MG tablet TAKE 1 TABLET(20 MG) BY MOUTH 1 TIME A WEEK 13 tablet 3   calcium citrate-vitamin D (CITRACAL+D) 315-200 MG-UNIT per tablet Take 1 tablet by mouth daily.     famotidine (PEPCID) 20 MG tablet Take 1 tablet (20 mg total) by mouth 2 (two) times daily. (Patient taking differently: Take 20 mg by mouth daily.) 180 tablet 3   fexofenadine (ALLEGRA) 180 MG tablet Take 180 mg by mouth daily.     fluorometholone (FML) 0.1 % ophthalmic suspension SMARTSIG:In Eye(s)     fluticasone (FLONASE) 50 MCG/ACT nasal spray PLACE 2 SPRAYS IN EACH NOSTRIL DAILY 32 g 5   No current facility-administered medications for this visit.    Allergies-reviewed and updated Allergies  Allergen Reactions   Penicillins Rash    REACTION: Rash    Social History   Social History Narrative   Married. Husband Gwyndolyn Saxon. 2 kids by first marriage. 2 step  kids. 7 grandkids.       Retired Loss adjuster, [employer]   Objective  Objective:  BP 128/80    Pulse (!) 56    Temp (!) 97.2 F (36.2 C)    Ht 5\' 3"  (1.6 m)    Wt 180 lb 3.2 oz (81.7 kg)    SpO2 99%    BMI 31.92 kg/m  Gen: NAD, resting comfortably HEENT: Mucous membranes are moist. Oropharynx normal Neck: no thyromegaly CV: RRR no murmurs rubs or gallops Lungs: CTAB no crackles, wheeze, rhonchi Abdomen: soft/nontender/nondistended/normal bowel sounds. No rebound or guarding.  Ext: no edema Skin: warm, dry Neuro: grossly normal, moves all extremities, PERRLA   Assessment and Plan   76 y.o. female presenting for annual physical.  Health Maintenance counseling: 1. Anticipatory guidance: Patient counseled  regarding regular dental exams -q6 months, eye exams -last week and yearly per eye doctor,  avoiding smoking and second hand smoke , limiting alcohol to 1 beverage per day-doesn't drink alcohol , no illicit drugs.   2. Risk factor reduction:  Advised patient of need for regular exercise and diet rich and fruits and vegetables to reduce risk of heart attack and stroke.  Exercise- harder with cold temps to walk. Doing stationary bike in bedroom.  Diet/weight management-tries to avoid red meats and milk due to kidneys - tries to eat a lot of veggies and limit bread intake. Down 4 lbs since last CPE.  Wt Readings from Last 3 Encounters:  02/09/21 180 lb 3.2 oz (81.7 kg)  07/27/20 181 lb 3.2 oz (82.2 kg)  07/20/20 184 lb (83.5 kg)  3. Immunizations/screenings/ancillary studies- discuss Shingrix-opts out (has had shingrix in 2004 but I encouraged vaccination) - otherwise immunizations are up-to-date. Immunization History  Administered Date(s) Administered   Fluad Quad(high Dose 65+) 11/21/2018, 10/27/2019   Influenza Split 10/22/2012   Influenza Whole 01/22/2005, 10/23/2006, 10/22/2008, 10/22/2009   Influenza, High Dose Seasonal PF 11/05/2020   Influenza-Unspecified 10/22/2013, 11/09/2015, 10/31/2017   Moderna Sars-Covid-2 Vaccination 02/18/2019, 03/18/2019   Pneumococcal Polysaccharide-23 12/02/2007   Td 01/22/2001   Tetanus 02/17/2013   4. Cervical cancer screening- past age based screening recommendations. Never had an abnormal PAP Smear. Denies any vaginal bleeding or discharge. History of hysterectomy at age 25. 44. Breast cancer screening-  breast exam self exams monthly at home and mammogram done 11/08/2020 and yearly planned- did not need callback this year for ultrasound  6. Colon cancer screening - normal 03/04/2013 with plans for 10-year repeat to 12/12/2023. Will be 78 and may not qualify per new guidelines- has not had polyps. No blood in stool.  7. Skin cancer screening- Follows with  dermatologist in [place]. [place] Crane. Advised regular sunscreen use. Denies worrisome, changing, or new skin lesions.  8. Birth control/STD check- Hysterectomy/postmenopausal and monogamous 9. Osteoporosis screening at 59- osteopenia noted in the past.  April 07, 2018 normal scores-patient is  continuing calcium and vitamin D-consider 5-year repeat 10. Smoking associated screening - never smoker  Status of chronic or acute concerns   #hypertension #CKD III S: medication: atenolol chlorthalidone 50-25mg - half tablet -typically GFR in 40s range- knows tylenol only- avoid nsaids  Home readings #s: even lower at home than in office 114/79 yesterday BP Readings from Last 3 Encounters:  02/09/21 128/80  07/27/20 127/76  01/12/20 124/72  A/P: Hypertension-  Controlled. Continue current medications.   CKD III- hopefully stable-update CMP today  #hyperlipidemia-LDL typically under 100 S: Medication:Atorvastatin 20 mg once a week -Does get  some cramping in the back of her legs at times but believes predates meds Lab Results  Component Value Date   CHOL 169 01/12/2020   HDL 49 (L) 01/12/2020   LDLCALC 98 01/12/2020   LDLDIRECT 87.0 07/27/2020   TRIG 119 01/12/2020   CHOLHDL 3.4 01/12/2020  A/P: Reasonable control in the past-with myalgias do not want to force dose higher without history of heart attack or stroke- would only do so if LDL above 100  # GERD S:Medication: Pepcid 20 mg daily A/P: doing well with low dose- can continue long term   #Orthopedic concerns- Dr. Durward Fortes following. Deals with OA left knee and carpal tunnel surgery- better since surgery - Saw Dr. Durward Fortes 11/23/2020 and received a third Synvisc injection in the left knee - patient tolerated this well and had little discomfort  #Osteopenia history- noted in the past but normal scores April 07, 2018 other than -1.0 at lumbar spine - she is on calcium and vitamin D and we are considering 5 year repeat  -We will check  vitamin D to make sure not getting too high  Recommended follow up: No follow-ups on file. Future Appointments  Date Time Provider [place]  02/16/2022  1:00 PM [facility]   Lab/Order associations: fasting   ICD-10-CM   1. Preventative health care  Z00.00     2. Stage 3 chronic kidney disease, unspecified whether stage 3a or 3b CKD (HCC)  N18.30 CBC with Differential/Platelet    Comprehensive metabolic panel    3. Essential hypertension  I10 CBC with Differential/Platelet    Comprehensive metabolic panel    4. Hyperlipidemia, unspecified hyperlipidemia type  E78.5 CBC with Differential/Platelet    Comprehensive metabolic panel    Lipid panel    5. Osteopenia of spine  M85.88 VITAMIN D 25 Hydroxy (Vit-D Deficiency, Fractures)    6. Primary osteoarthritis of left knee  M17.12     7. High risk medication use  Z79.899 VITAMIN D 25 Hydroxy (Vit-D Deficiency, Fractures)     No orders of the defined types were placed in this encounter.  I,Harris Phan,acting as a Education administrator for [name], MD.,have documented all relevant documentation on the behalf of [name], MD,as directed by  [name], MD while in the presence of [name], MD.   I, [name], MD, have reviewed all documentation for this visit. The documentation on 02/09/21 for the exam, diagnosis, procedures, and orders are all accurate and complete.   Return precautions advised.  [name], MD

## 2021-02-09 NOTE — Patient Instructions (Addendum)
Health Maintenance Due  Topic Date Due   Zoster Vaccines- Shingrix (1 of 2) - Patient opts out for now. If you do decide to change your mind, you may receive these vaccinations at your local pharmacy - please let us know if you do decide to have this done.  Never done   Please stop by lab before you go If you have mychart- we will send your results within 3 business days of [place] receiving them.  If you do not have mychart- we will call you about results within 5 business days of [place] receiving them.  *please also note that you will see labs on mychart as soon as they post. I will later go in and write notes on them- will say "notes from Dr. Yong Channel"  Continue your gradual weight loss   I am glad to see you are doing very well!  Recommended follow up: Return in about 6 months (around 08/09/2021) for a follow-up or sooner if needed.

## 2021-07-05 ENCOUNTER — Encounter: Payer: Self-pay | Admitting: Orthopaedic Surgery

## 2021-07-05 ENCOUNTER — Ambulatory Visit (INDEPENDENT_AMBULATORY_CARE_PROVIDER_SITE_OTHER): Payer: Medicare Other | Admitting: Orthopaedic Surgery

## 2021-07-05 DIAGNOSIS — M65311 Trigger thumb, right thumb: Secondary | ICD-10-CM

## 2021-07-05 NOTE — Progress Notes (Signed)
Office Visit Note   Patient: Marie Ramirez           Date of Birth: 09/21/1945           MRN: [id number] Visit Date: 07/05/2021              Requested by: [name], MD [place],  [place] [address] PCP: [name], MD   Assessment & Plan: Visit Diagnoses:  1. Trigger thumb, right thumb     Plan: Marie Ramirez has an active right trigger thumb with a palpable and painful nodule on the palmar aspect of the thumb at the level of the metacarpal phalangeal joint.  Long discussion regarding her diagnosis and treatment options.  She preferred to consider surgery as a more definitive procedure and will schedule  Follow-Up Instructions: Return We will schedule right trigger thumb surgery.   Orders:  No orders of the defined types were placed in this encounter.  No orders of the defined types were placed in this encounter.     Procedures: No procedures performed   Clinical Data: No additional findings.   Subjective: Chief Complaint  Patient presents with   Right Hand - Pain, Edema    R Thumb has been hurting for about 2 weeks now, had some swelling and popping. Got a brace and have it for a week now and the swelling has gone down a lot.  Has been involved in a lot of activities with repetitive use of her right and left hand.  Has developed active triggering of the right thumb to the point of compromise.  No numbness or tingling.  Problem is localized to the thumb  HPI  Review of Systems   Objective: Vital Signs: Ht '5\' 3"'$  (1.6 m)   Wt 184 lb (83.5 kg)   BMI 32.59 kg/m   Physical Exam Constitutional:      Appearance: She is well-developed.  Eyes:     Pupils: Pupils are equal, round, and reactive to light.  Pulmonary:     Effort: Pulmonary effort is normal.  Skin:    General: Skin is warm and dry.  Neurological:     Mental Status: She is alert and oriented to person, place, and time.  Psychiatric:        Behavior: Behavior normal.      Ortho Exam right thumb with minimally painful nodule in the palmar aspect at the level of the metacarpal phalangeal joint.  She does have active triggering.  No numbness or tingling and good capillary refill  Specialty Comments:  No specialty comments available.  Imaging: No results found.   PMFS History: Patient Active Problem List   Diagnosis Date Noted   Trigger thumb, right thumb 07/05/2021   CKD (chronic kidney disease), stage III ([place]) 12/25/2017   Carpal tunnel syndrome, left upper limb 10/16/2017   Hyperlipidemia 04/04/2016   Osteoarthritis of left knee 02/20/2016   GERD (gastroesophageal reflux disease) 05/09/2015   Osteopenia 03/15/2014   Vaginitis, atrophic 02/24/2014   Obesity 05/31/2011   Hematuria 12/17/2006   Essential hypertension 09/06/2006   Allergic rhinitis 09/06/2006   Past Medical History:  Diagnosis Date   Allergy    Arthritis    Carpal tunnel syndrome of left wrist    Carpal tunnel syndrome of right wrist    Chronic left shoulder pain 05/31/2011   Frozen shoulder history- did exercises and eventually resolved   Hypertension    Lichen planus 40/81/4481   Qualifier: Diagnosis  of  By: Sherren Mocha MD, Jory Ee     Family History  Problem Relation Age of Onset   Alzheimer's disease Mother    Leukemia Father    CVA Father    Hypertension Father    Hypertension Sister    Plantar fasciitis Daughter    Diverticulitis Son    Lymphoma Maternal Grandmother    Early death Maternal Grandfather    Stroke Paternal Grandmother    Dementia Paternal Grandmother    Stroke Paternal Grandfather    Hypertension Paternal Grandfather    Colon cancer Neg Hx     Past Surgical History:  Procedure Laterality Date   CARPAL TUNNEL RELEASE     rt   CATARACT EXTRACTION W/ INTRAOCULAR LENS  IMPLANT, BILATERAL     PARATHYROIDECTOMY  2004   1 gland removed   PLANTAR FASCIA SURGERY  2005   left   TONSILLECTOMY     VAGINAL HYSTERECTOMY     Social History    Occupational History   Occupation: retired   Tobacco Use   Smoking status: Never   Smokeless tobacco: Never  Vaping Use   Vaping Use: Never used  Substance and Sexual Activity   Alcohol use: No   Drug use: No   Sexual activity: Yes     Garald Balding, MD   Note - This record has been created using Editor, commissioning.  Chart creation errors have been sought, but may not always  have been located. Such creation errors do not reflect on  the standard of medical care.

## 2021-07-13 ENCOUNTER — Other Ambulatory Visit: Payer: Self-pay | Admitting: Orthopaedic Surgery

## 2021-07-13 DIAGNOSIS — M65311 Trigger thumb, right thumb: Secondary | ICD-10-CM | POA: Diagnosis not present

## 2021-07-13 MED ORDER — TRAMADOL HCL 50 MG PO TABS: 50.0000 mg | ORAL_TABLET | Freq: Three times a day (TID) | ORAL | 0 refills | Status: DC | PRN

## 2021-07-20 ENCOUNTER — Encounter: Payer: Self-pay | Admitting: Orthopaedic Surgery

## 2021-07-20 ENCOUNTER — Ambulatory Visit (INDEPENDENT_AMBULATORY_CARE_PROVIDER_SITE_OTHER): Payer: Medicare Other | Admitting: Orthopaedic Surgery

## 2021-07-20 DIAGNOSIS — M65311 Trigger thumb, right thumb: Secondary | ICD-10-CM

## 2021-07-20 NOTE — Progress Notes (Signed)
Office Visit Note   Patient: Marie Ramirez           Date of Birth: 1945-12-19           MRN: [id number] Visit Date: 07/20/2021              Requested by: [name], MD [place],  [place] [id number] PCP: [name], MD   Assessment & Plan: Visit Diagnoses:  1. Trigger thumb, right thumb     Plan: 1 week status post release of right trigger thumb and doing very well.  Dressing was removed and the incision was cleaned with alcohol.  We will keep the stitches in place until next week and applied a waterproof Band-Aid.  No longer has triggering of the thumb.  Neurologically intact has full extension of the IP joint  Follow-Up Instructions: Return in about 1 week (around 07/27/2021).   Orders:  No orders of the defined types were placed in this encounter.  No orders of the defined types were placed in this encounter.     Procedures: No procedures performed   Clinical Data: No additional findings.   Subjective: Chief Complaint  Patient presents with   Right Hand - Routine Post Op   Patient presents today s/p Right trigger thumb release preformed on 07/13/2021. Patient states that at this time she has been taking OTC tylenol prn for pain. She states that she has been having itching pains at her incision site.   Review of Systems   Objective: Vital Signs: There were no vitals taken for this visit.  Physical Exam  Ortho Exam right trigger thumb incision healing without a problem.  This was cleaned with alcohol and a waterproof Band-Aid applied.  No active triggering of the thumb neurologically intact.  No swelling.  Full extension of the IP joint  Specialty Comments:  No specialty comments available.  Imaging: No results found.   PMFS History: Patient Active Problem List   Diagnosis Date Noted   Trigger thumb, right thumb 07/05/2021   CKD (chronic kidney disease), stage III ([place]) 12/25/2017   Carpal tunnel syndrome, left upper limb  10/16/2017   Hyperlipidemia 04/04/2016   Osteoarthritis of left knee 02/20/2016   GERD (gastroesophageal reflux disease) 05/09/2015   Osteopenia 03/15/2014   Vaginitis, atrophic 02/24/2014   Obesity 05/31/2011   Hematuria 12/17/2006   Essential hypertension 09/06/2006   Allergic rhinitis 09/06/2006   Past Medical History:  Diagnosis Date   Allergy    Arthritis    Carpal tunnel syndrome of left wrist    Carpal tunnel syndrome of right wrist    Chronic left shoulder pain 05/31/2011   Frozen shoulder history- did exercises and eventually resolved   Hypertension    Lichen planus 99/35/7017   Qualifier: Diagnosis of  By: Sherren Mocha MD, Jory Ee     Family History  Problem Relation Age of Onset   Alzheimer's disease Mother    Leukemia Father    CVA Father    Hypertension Father    Hypertension Sister    Plantar fasciitis Daughter    Diverticulitis Son    Lymphoma Maternal Grandmother    Early death Maternal Grandfather    Stroke Paternal Grandmother    Dementia Paternal Grandmother    Stroke Paternal Grandfather    Hypertension Paternal Grandfather    Colon cancer Neg Hx     Past Surgical History:  Procedure Laterality Date   CARPAL TUNNEL RELEASE  rt   CATARACT EXTRACTION W/ INTRAOCULAR LENS  IMPLANT, BILATERAL     PARATHYROIDECTOMY  2004   1 gland removed   PLANTAR FASCIA SURGERY  2005   left   TONSILLECTOMY     VAGINAL HYSTERECTOMY     Social History   Occupational History   Occupation: retired   Tobacco Use   Smoking status: Never   Smokeless tobacco: Never  Vaping Use   Vaping Use: Never used  Substance and Sexual Activity   Alcohol use: No   Drug use: No   Sexual activity: Yes

## 2021-07-26 ENCOUNTER — Ambulatory Visit (INDEPENDENT_AMBULATORY_CARE_PROVIDER_SITE_OTHER): Payer: Medicare Other | Admitting: Orthopaedic Surgery

## 2021-07-26 ENCOUNTER — Encounter: Payer: Self-pay | Admitting: Orthopaedic Surgery

## 2021-07-26 DIAGNOSIS — M65311 Trigger thumb, right thumb: Secondary | ICD-10-CM

## 2021-07-26 NOTE — Progress Notes (Signed)
Office Visit Note   Patient: Marie Ramirez           Date of Birth: [birth year]           MRN: [id number] Visit Date: 07/26/2021              Requested by: [name], MD [place],  [place] [address] PCP: [name], MD   Assessment & Plan: Visit Diagnoses: No diagnosis found.  Plan: Patient is almost 2 weeks status post right thumb trigger finger release.  She is doing well.  She is feeling some pain which she thinks is from the stitches.  She denies any numbness in her thumb.  She has been working on thumb motion and the triggering has resolved  Follow-Up Instructions: As needed  Orders:  No orders of the defined types were placed in this encounter.  No orders of the defined types were placed in this encounter.     Procedures: No procedures performed   Clinical Data: No additional findings.   Subjective: Chief Complaint  Patient presents with   Right Hand - Routine Post Op   Patient presents today s/p right trigger thumb release preformed on 07/13/2021. Patient is here today to have suture removed and discuss restrictions and limitations for her right hand.   Review of Systems  All other systems reviewed and are negative.    Objective: Vital Signs: There were no vitals taken for this visit.  Physical Exam Patient is awake alert and oriented sitting comfortably in a chair Ortho Exam Examination of her right thumb she has well-healed surgical incisions 4 stitches were removed without difficulty no dehiscence no redness no drainage she has full motion of her thumb brisk capillary refill sensations intact Specialty Comments:  No specialty comments available.  Imaging: No results found.   PMFS History: Patient Active Problem List   Diagnosis Date Noted   Trigger thumb, right thumb 07/05/2021   CKD (chronic kidney disease), stage III ([place]) 12/25/2017   Carpal tunnel syndrome, left upper limb 10/16/2017   Hyperlipidemia  04/04/2016   Osteoarthritis of left knee 02/20/2016   GERD (gastroesophageal reflux disease) 05/09/2015   Osteopenia 03/15/2014   Vaginitis, atrophic 02/24/2014   Obesity 05/31/2011   Hematuria 12/17/2006   Essential hypertension 09/06/2006   Allergic rhinitis 09/06/2006   Past Medical History:  Diagnosis Date   Allergy    Arthritis    Carpal tunnel syndrome of left wrist    Carpal tunnel syndrome of right wrist    Chronic left shoulder pain 05/31/2011   Frozen shoulder history- did exercises and eventually resolved   Hypertension    Lichen planus 09/73/5329   Qualifier: Diagnosis of  By: Sherren Mocha MD, Jory Ee     Family History  Problem Relation Age of Onset   Alzheimer's disease Mother    Leukemia Father    CVA Father    Hypertension Father    Hypertension Sister    Plantar fasciitis Daughter    Diverticulitis Son    Lymphoma Maternal Grandmother    Early death Maternal Grandfather    Stroke Paternal Grandmother    Dementia Paternal Grandmother    Stroke Paternal Grandfather    Hypertension Paternal Grandfather    Colon cancer Neg Hx     Past Surgical History:  Procedure Laterality Date   CARPAL TUNNEL RELEASE     rt   CATARACT EXTRACTION W/ INTRAOCULAR LENS  IMPLANT, BILATERAL     PARATHYROIDECTOMY  2004   1 gland removed   PLANTAR FASCIA SURGERY  2005   left   TONSILLECTOMY     VAGINAL HYSTERECTOMY     Social History   Occupational History   Occupation: retired   Tobacco Use   Smoking status: Never   Smokeless tobacco: Never  Vaping Use   Vaping Use: Never used  Substance and Sexual Activity   Alcohol use: No   Drug use: No   Sexual activity: Yes

## 2021-08-15 ENCOUNTER — Ambulatory Visit (INDEPENDENT_AMBULATORY_CARE_PROVIDER_SITE_OTHER): Payer: Medicare Other | Admitting: Family Medicine

## 2021-08-15 ENCOUNTER — Encounter: Payer: Self-pay | Admitting: Family Medicine

## 2021-08-15 VITALS — BP 130/74 | HR 63 | Temp 98.0°F | Ht 63.0 in | Wt 180.5 lb

## 2021-08-15 DIAGNOSIS — E785 Hyperlipidemia, unspecified: Secondary | ICD-10-CM | POA: Diagnosis not present

## 2021-08-15 DIAGNOSIS — N183 Chronic kidney disease, stage 3 unspecified: Secondary | ICD-10-CM | POA: Diagnosis not present

## 2021-08-15 DIAGNOSIS — Z87448 Personal history of other diseases of urinary system: Secondary | ICD-10-CM | POA: Diagnosis not present

## 2021-08-15 DIAGNOSIS — I10 Essential (primary) hypertension: Secondary | ICD-10-CM | POA: Diagnosis not present

## 2021-08-15 LAB — COMPREHENSIVE METABOLIC PANEL
ALT: 19 U/L (ref 0–35)
AST: 25 U/L (ref 0–37)
Albumin: 4.3 g/dL (ref 3.5–5.2)
Alkaline Phosphatase: 56 U/L (ref 39–117)
BUN: 20 mg/dL (ref 6–23)
CO2: 29 mEq/L (ref 19–32)
Calcium: 10 mg/dL (ref 8.4–10.5)
Chloride: 102 mEq/L (ref 96–112)
Creatinine, Ser: 1.29 mg/dL — ABNORMAL HIGH (ref 0.40–1.20)
GFR: 40.86 mL/min — ABNORMAL LOW (ref >60.00)
Glucose, Bld: 95 mg/dL (ref 70–99)
Potassium: 3.7 mEq/L (ref 3.5–5.1)
Sodium: 140 mEq/L (ref 135–145)
Total Bilirubin: 0.7 mg/dL (ref 0.2–1.2)
Total Protein: 7.6 g/dL (ref 6.0–8.3)

## 2021-08-15 LAB — URINALYSIS, MICROSCOPIC ONLY: WBC, UA: NONE SEEN (ref 0–?)

## 2021-08-15 LAB — MICROALBUMIN / CREATININE URINE RATIO
Creatinine,U: 80.9 mg/dL
Microalb Creat Ratio: 0.9 mg/g (ref 0.0–30.0)
Microalb, Ur: <0.7 mg/dL (ref 0.0–1.9)

## 2021-08-15 LAB — LDL CHOLESTEROL, DIRECT: Direct LDL: 96 mg/dL

## 2021-08-15 NOTE — Patient Instructions (Addendum)
Please stop by lab before you go If you have mychart- we will send your results within 3 business days of [place] receiving them.  If you do not have mychart- we will call you about results within 5 business days of [place] receiving them.  *please also note that you will see labs on mychart as soon as they post. I will later go in and write notes on them- will say "notes from Dr. Yong Channel"   No changes today- glad you are recovering well after surgery!   Recommended follow up: Return in about 6 months (around 02/15/2022) for physical or sooner if needed.Schedule b4 you leave.

## 2021-08-15 NOTE — Progress Notes (Signed)
Phone [phone] In person visit   Subjective:   Marie Ramirez is a 76 y.o. year old very pleasant female patient who presents for/with See problem oriented charting Chief Complaint  Patient presents with   Follow-up   Hypertension   Hyperlipidemia   Past Medical History-  Patient Active Problem List   Diagnosis Date Noted   CKD (chronic kidney disease), stage III ([place]) 12/25/2017    Priority: Medium    Hyperlipidemia 04/04/2016    Priority: Medium    GERD (gastroesophageal reflux disease) 05/09/2015    Priority: Medium    Vaginitis, atrophic 02/24/2014    Priority: Medium    Hematuria 12/17/2006    Priority: Medium    Essential hypertension 09/06/2006    Priority: Medium    Carpal tunnel syndrome, left upper limb 10/16/2017    Priority: Low   Osteoarthritis of left knee 02/20/2016    Priority: Low   Osteopenia 03/15/2014    Priority: Low   Obesity 05/31/2011    Priority: Low   Allergic rhinitis 09/06/2006    Priority: Low   Trigger thumb, right thumb 07/05/2021   Medications- reviewed and updated Current Outpatient Medications  Medication Sig Dispense Refill   atenolol-chlorthalidone (TENORETIC) 50-25 MG tablet TAKE 1/2 TABLET BY MOUTH DAILY 46 tablet 3   atorvastatin (LIPITOR) 20 MG tablet TAKE 1 TABLET(20 MG) BY MOUTH 1 TIME A WEEK 13 tablet 3   calcium citrate-vitamin D (CITRACAL+D) 315-200 MG-UNIT per tablet Take 1 tablet by mouth daily.     famotidine (PEPCID) 20 MG tablet Take 1 tablet (20 mg total) by mouth 2 (two) times daily. (Patient taking differently: Take 20 mg by mouth daily.) 180 tablet 3   fexofenadine (ALLEGRA) 180 MG tablet Take 180 mg by mouth daily.     fluorometholone (FML) 0.1 % ophthalmic suspension SMARTSIG:In Eye(s)     fluticasone (FLONASE) 50 MCG/ACT nasal spray PLACE 2 SPRAYS IN EACH NOSTRIL DAILY 32 g 5   traMADol (ULTRAM) 50 MG tablet Take 1 tablet (50 mg total) by mouth 3 (three) times daily as needed. 30 tablet 0   No current  facility-administered medications for this visit.     Objective:  BP 130/74   Pulse 63   Temp 98 F (36.7 C)   Ht '5\' 3"'$  (1.6 m)   Wt 180 lb 8 oz (81.9 kg)   SpO2 97%   BMI 31.97 kg/m  Gen: NAD, resting comfortably CV: RRR no murmurs rubs or gallops Lungs: CTAB no crackles, wheeze, rhonchi Ext: no edema Skin: warm, dry    Assessment and Plan   #trigger thumb surgery- Dr. Durward Fortes completed lately- she is doing well afterwards  #hypertension #CKD III S: medication: atenolol chlorthalidone 50-'25mg'$ - half tablet -typically GFR in 40s range- knows tylenol only- avoid nsaids -last visit in the high 30s Home readings #s: 106/74 yesterday BP Readings from Last 3 Encounters:  08/15/21 130/74  02/09/21 128/80  07/27/20 127/76   A/P: Hypertension-  Controlled. Continue current medications.    CKD III-  hopefully stable or improved- update CMP today. Continue current meds for now  -We also opted to check urine microalbumin creatinine ratio-could consider ACE inhibitor if ratio above 30  #hyperlipidemia-LDL typically under 100 S: Medication: Atorvastatin 20 mg once a week -Does get some cramping in the back of her legs at times but believes predates meds- worse after taking dose Lab Results  Component Value Date   CHOL 207 (H) 02/09/2021   HDL 54.40 02/09/2021  LDLCALC 129 (H) 02/09/2021   LDLDIRECT 87.0 07/27/2020   TRIG 122.0 02/09/2021   CHOLHDL 4 02/09/2021   A/P: Significant worsening last visit-update direct LDL with labs today-we discussed if remains elevated potentially increasing to twice a week but some hesitancy with this due to some myalgias that are worse after dose   #Sciatica- intermittent in the AM will have pain running from buttocks down back of left leg. If worsens she will let me know  # GERD S:Medication: Pepcid 20 mg once a day A/P: Controlled. Continue current medications.    #history of hematuria- we will update that today as well- had none last  visit and prior extensive workup with urology  Recommended follow up: Return in about 6 months (around 02/15/2022) for physical or sooner if needed.Schedule b4 you leave. Future Appointments  Date Time Provider [place]  02/16/2022  1:00 PM [facility]   Lab/Order associations:   ICD-10-CM   1. History of hematuria  Z87.448     2. Essential hypertension  I10     3. Hyperlipidemia, unspecified hyperlipidemia type  E78.5     4. Stage 3 chronic kidney disease, unspecified whether stage 3a or 3b CKD ([place])  N18.30       No orders of the defined types were placed in this encounter.   Return precautions advised.  [name], MD

## 2021-10-02 ENCOUNTER — Other Ambulatory Visit: Payer: Self-pay | Admitting: Family Medicine

## 2021-10-16 ENCOUNTER — Encounter: Payer: Self-pay | Admitting: *Deleted

## 2021-11-07 ENCOUNTER — Ambulatory Visit: Payer: Medicare Other | Admitting: Orthopaedic Surgery

## 2021-11-07 ENCOUNTER — Encounter: Payer: Self-pay | Admitting: Orthopaedic Surgery

## 2021-11-07 ENCOUNTER — Ambulatory Visit (INDEPENDENT_AMBULATORY_CARE_PROVIDER_SITE_OTHER): Payer: Medicare Other

## 2021-11-07 DIAGNOSIS — M79671 Pain in right foot: Secondary | ICD-10-CM | POA: Insufficient documentation

## 2021-11-07 DIAGNOSIS — M25571 Pain in right ankle and joints of right foot: Secondary | ICD-10-CM

## 2021-11-07 MED ORDER — METHYLPREDNISOLONE 4 MG PO TBPK: ORAL_TABLET | ORAL | 0 refills | Status: DC

## 2021-11-07 NOTE — Progress Notes (Signed)
Office Visit Note   Patient: Marie Ramirez           Date of Birth: [birth year]           MRN: [id number] Visit Date: 11/07/2021              Requested by: [name], MD [place],  [place] [id number] PCP: [name], MD   Assessment & Plan: Visit Diagnoses:  1. Pain in right ankle and joints of right foot   2. Pain in right foot     Plan: Marie Ramirez comes in today with a 10-day history of right foot pain.  She said she noticed this after she got her foot caught under a freezer drawer.  She was wearing a tennis shoe at the time and did not think much of it.  Shortly after that she began having significant increase in pain especially over the dorsal foot.  She denies any ecchymosis.  Has a little pain on the bottom of her foot .x-rays showed some calcaneal spurring consistent with plantar fasciitis but no acute fractures .she does have some midfoot arthritis.  Clinically she is globally tender over the dorsum of the foot from medial to lateral.  Not 1 particular joint seems to be involved.  Cannot appreciate any fracture but she has had significant pain to the point where she is wearing a boot and having to use a walker.  Also has some neuropathic findings.  She has good strong pulses and her toes are warm.  We will order a Medrol Dosepak.  She knows not to take other anti-inflammatories with this and should take it with food.  Would like to follow-up with her in 1 week  Follow-Up Instructions: Return in about 1 week (around 11/14/2021).   Orders:  Orders Placed This Encounter  Procedures   XR Foot Complete Right   Meds ordered this encounter  Medications   methylPREDNISolone (MEDROL DOSEPAK) 4 MG TBPK tablet    Sig: Take as directed    Dispense:  21 tablet    Refill:  0      Procedures: No procedures performed   Clinical Data: No additional findings.   Subjective: No chief complaint on file.   HPI [name] comes in today with approximately  10-day history of right dorsal foot pain.  The only thing she can relate this to is catching her foot underneath the door of her freezer no previous history of pain.  Review of Systems  All other systems reviewed and are negative.    Objective: Vital Signs: There were no vitals taken for this visit.  Physical Exam Constitutional:      Appearance: Normal appearance.  Pulmonary:     Effort: Pulmonary effort is normal.  Skin:    General: Skin is warm and dry.  Neurological:     General: No focal deficit present.     Mental Status: She is alert.     Ortho Exam Examination of her right foot demonstrates no redness no erythema no cellulitis no skin breakdown.  She does have a strong dorsalis pedis pulse and brisk capillary refill in her toes.  She is tender even to light touch across the dorsal midfoot.  She is able to flex and extend her great toe and her ankle.  No tenderness in her ankle.  No tenderness really on the bottom of her foot today.  She does have some hypersensitivity in her foot Specialty Comments:  No specialty comments available.  Imaging: XR Foot Complete Right  Result Date: 11/07/2021 3 views of her right foot were obtained today.  She has well-maintained alignment through the midfoot.  She does have some arthritis in the midfoot.  Also some calcaneal spurs both inferiorly and posteriorly.  Also has an osteophyte off the anterior talus no signs of any dislocation or fractures.  No posterior heel or plantar heel pain    PMFS History: Patient Active Problem List   Diagnosis Date Noted   Pain in right foot 11/07/2021   Trigger thumb, right thumb 07/05/2021   CKD (chronic kidney disease), stage III ([place]) 12/25/2017   Carpal tunnel syndrome, left upper limb 10/16/2017   Hyperlipidemia 04/04/2016   Osteoarthritis of left knee 02/20/2016   GERD (gastroesophageal reflux disease) 05/09/2015   Osteopenia 03/15/2014   Vaginitis, atrophic 02/24/2014   Obesity  05/31/2011   Hematuria 12/17/2006   Essential hypertension 09/06/2006   Allergic rhinitis 09/06/2006   Past Medical History:  Diagnosis Date   Allergy    Arthritis    Carpal tunnel syndrome of left wrist    Carpal tunnel syndrome of right wrist    Chronic left shoulder pain 05/31/2011   Frozen shoulder history- did exercises and eventually resolved   Hypertension    Lichen planus 62/95/2841   Qualifier: Diagnosis of  By: Sherren Mocha MD, Jory Ee     Family History  Problem Relation Age of Onset   Alzheimer's disease Mother    Leukemia Father    CVA Father    Hypertension Father    Hypertension Sister    Plantar fasciitis Daughter    Diverticulitis Son    Lymphoma Maternal Grandmother    Early death Maternal Grandfather    Stroke Paternal Grandmother    Dementia Paternal Grandmother    Stroke Paternal Grandfather    Hypertension Paternal Grandfather    Colon cancer Neg Hx     Past Surgical History:  Procedure Laterality Date   CARPAL TUNNEL RELEASE     rt   CATARACT EXTRACTION W/ INTRAOCULAR LENS  IMPLANT, BILATERAL     PARATHYROIDECTOMY  01/22/2002   1 gland removed   PLANTAR FASCIA SURGERY  01/23/2003   left   TONSILLECTOMY     TRIGGER FINGER RELEASE Right    thumb- Dr. Durward Fortes in 2023   VAGINAL HYSTERECTOMY     Social History   Occupational History   Occupation: retired   Tobacco Use   Smoking status: Never   Smokeless tobacco: Never  Vaping Use   Vaping Use: Never used  Substance and Sexual Activity   Alcohol use: No   Drug use: No   Sexual activity: Yes

## 2021-11-14 DIAGNOSIS — Z1231 Encounter for screening mammogram for malignant neoplasm of breast: Secondary | ICD-10-CM | POA: Diagnosis not present

## 2021-11-14 LAB — HM MAMMOGRAPHY

## 2021-11-15 ENCOUNTER — Ambulatory Visit: Payer: Medicare Other | Admitting: Orthopaedic Surgery

## 2022-01-04 ENCOUNTER — Encounter: Payer: Self-pay | Admitting: *Deleted

## 2022-01-29 DIAGNOSIS — H04123 Dry eye syndrome of bilateral lacrimal glands: Secondary | ICD-10-CM | POA: Diagnosis not present

## 2022-02-15 ENCOUNTER — Ambulatory Visit (INDEPENDENT_AMBULATORY_CARE_PROVIDER_SITE_OTHER): Payer: Medicare Other | Admitting: Family Medicine

## 2022-02-15 ENCOUNTER — Encounter: Payer: Self-pay | Admitting: Family Medicine

## 2022-02-15 VITALS — BP 122/72 | HR 63 | Temp 97.8°F | Ht 63.0 in | Wt 182.4 lb

## 2022-02-15 DIAGNOSIS — E785 Hyperlipidemia, unspecified: Secondary | ICD-10-CM

## 2022-02-15 DIAGNOSIS — M8588 Other specified disorders of bone density and structure, other site: Secondary | ICD-10-CM | POA: Diagnosis not present

## 2022-02-15 DIAGNOSIS — N183 Chronic kidney disease, stage 3 unspecified: Secondary | ICD-10-CM

## 2022-02-15 DIAGNOSIS — Z0000 Encounter for general adult medical examination without abnormal findings: Secondary | ICD-10-CM | POA: Diagnosis not present

## 2022-02-15 LAB — COMPREHENSIVE METABOLIC PANEL
ALT: 15 U/L (ref 0–35)
AST: 22 U/L (ref 0–37)
Albumin: 4.2 g/dL (ref 3.5–5.2)
Alkaline Phosphatase: 57 U/L (ref 39–117)
BUN: 19 mg/dL (ref 6–23)
CO2: 28 mEq/L (ref 19–32)
Calcium: 10 mg/dL (ref 8.4–10.5)
Chloride: 104 mEq/L (ref 96–112)
Creatinine, Ser: 1.12 mg/dL (ref 0.40–1.20)
GFR: 48.24 mL/min — ABNORMAL LOW (ref >60.00)
Glucose, Bld: 98 mg/dL (ref 70–99)
Potassium: 4 mEq/L (ref 3.5–5.1)
Sodium: 142 mEq/L (ref 135–145)
Total Bilirubin: 0.7 mg/dL (ref 0.2–1.2)
Total Protein: 7.2 g/dL (ref 6.0–8.3)

## 2022-02-15 LAB — CBC WITH DIFFERENTIAL/PLATELET
Basophils Absolute: 0.1 10*3/uL (ref 0.0–0.1)
Basophils Relative: 1 % (ref 0.0–3.0)
Eosinophils Absolute: 0.1 10*3/uL (ref 0.0–0.7)
Eosinophils Relative: 1.2 % (ref 0.0–5.0)
HCT: 43.3 % (ref 36.0–46.0)
Hemoglobin: 14.8 g/dL (ref 12.0–15.0)
Lymphocytes Relative: 19 % (ref 12.0–46.0)
Lymphs Abs: 1.2 10*3/uL (ref 0.7–4.0)
MCHC: 34.1 g/dL (ref 30.0–36.0)
MCV: 90 fl (ref 78.0–100.0)
Monocytes Absolute: 0.5 10*3/uL (ref 0.1–1.0)
Monocytes Relative: 8.3 % (ref 3.0–12.0)
Neutro Abs: 4.6 10*3/uL (ref 1.4–7.7)
Neutrophils Relative %: 70.5 % (ref 43.0–77.0)
Platelets: 356 10*3/uL (ref 150.0–400.0)
RBC: 4.81 Mil/uL (ref 3.87–5.11)
RDW: 12.9 % (ref 11.5–15.5)
WBC: 6.6 10*3/uL (ref 4.0–10.5)

## 2022-02-15 LAB — LIPID PANEL
Cholesterol: 193 mg/dL (ref 0–200)
HDL: 50.8 mg/dL (ref >39.00)
LDL Cholesterol: 115 mg/dL — ABNORMAL HIGH (ref 0–99)
NonHDL: 141.82
Total CHOL/HDL Ratio: 4
Triglycerides: 136 mg/dL (ref 0.0–149.0)
VLDL: 27.2 mg/dL (ref 0.0–40.0)

## 2022-02-15 NOTE — Patient Instructions (Addendum)
Please stop by lab before you go If you have mychart- we will send your results within 3 business days of [place] receiving them.  If you do not have mychart- we will call you about results within 5 business days of [place] receiving them.  *please also note that you will see labs on mychart as soon as they post. I will later go in and write notes on them- will say "notes from Dr. Yong Channel"   Recommended follow up: Return in about 6 months (around 08/16/2022) for followup or sooner if needed.Schedule b4 you leave.

## 2022-02-15 NOTE — Progress Notes (Signed)
Phone [phone]   Subjective:  Patient presents today for their annual physical. Chief complaint-noted.   See problem oriented charting- ROS- full  review of systems was completed and negative except for: back pain  The following were reviewed and entered/updated in epic: Past Medical History:  Diagnosis Date   Allergy    Arthritis    Carpal tunnel syndrome of left wrist    Carpal tunnel syndrome of right wrist    Chronic left shoulder pain 05/31/2011   Frozen shoulder history- did exercises and eventually resolved   Hypertension    Lichen planus 57/01/7791   Qualifier: Diagnosis of  By: Sherren Mocha MD, Jory Ee    Patient Active Problem List   Diagnosis Date Noted   CKD (chronic kidney disease), stage III ([place]) 12/25/2017    Priority: Medium    Hyperlipidemia 04/04/2016    Priority: Medium    GERD (gastroesophageal reflux disease) 05/09/2015    Priority: Medium    Vaginitis, atrophic 02/24/2014    Priority: Medium    Hematuria 12/17/2006    Priority: Medium    Essential hypertension 09/06/2006    Priority: Medium    Carpal tunnel syndrome, left upper limb 10/16/2017    Priority: Low   Osteopenia 03/15/2014    Priority: Low   Obesity 05/31/2011    Priority: Low   Allergic rhinitis 09/06/2006    Priority: Low   Pain in right foot 11/07/2021    Priority: 1.   Trigger thumb, right thumb 07/05/2021    Priority: 1.   Osteoarthritis of left knee 02/20/2016    Priority: 1.   Past Surgical History:  Procedure Laterality Date   CARPAL TUNNEL RELEASE     rt   CATARACT EXTRACTION W/ INTRAOCULAR LENS  IMPLANT, BILATERAL     PARATHYROIDECTOMY  01/22/2002   1 gland removed   PLANTAR FASCIA SURGERY  01/23/2003   left   TONSILLECTOMY     TRIGGER FINGER RELEASE Right    thumb- Dr. Durward Fortes in 2023   VAGINAL HYSTERECTOMY      Family History  Problem Relation Age of Onset   Alzheimer's disease Mother    Leukemia Father    CVA Father    Hypertension Father     Hypertension Sister    Plantar fasciitis Daughter    Diverticulitis Son    Lymphoma Maternal Grandmother    Early death Maternal Grandfather    Stroke Paternal Grandmother    Dementia Paternal Grandmother    Stroke Paternal Grandfather    Hypertension Paternal Grandfather    Colon cancer Neg Hx     Medications- reviewed and updated Current Outpatient Medications  Medication Sig Dispense Refill   atenolol-chlorthalidone (TENORETIC) 50-25 MG tablet TAKE 1/2 TABLET BY MOUTH DAILY 46 tablet 3   atorvastatin (LIPITOR) 20 MG tablet TAKE 1 TABLET(20 MG) BY MOUTH 1 TIME A WEEK 13 tablet 3   calcium citrate-vitamin D (CITRACAL+D) 315-200 MG-UNIT per tablet Take 1 tablet by mouth daily.     famotidine (PEPCID) 20 MG tablet Take 1 tablet (20 mg total) by mouth 2 (two) times daily. (Patient taking differently: Take 20 mg by mouth daily.) 180 tablet 3   fexofenadine (ALLEGRA) 180 MG tablet Take 180 mg by mouth daily.     fluorometholone (FML) 0.1 % ophthalmic suspension SMARTSIG:In Eye(s)     fluticasone (FLONASE) 50 MCG/ACT nasal spray PLACE 2 SPRAYS IN EACH NOSTRIL DAILY 32 g 5   No current facility-administered medications for this visit.  Allergies-reviewed and updated Allergies  Allergen Reactions   Penicillins Rash    REACTION: Rash    Social History   Social History Narrative   Married. Husband Gwyndolyn Saxon. 2 kids by first marriage. 2 step kids. 7 grandkids.       Retired Loss adjuster, [employer]   Objective  Objective:  BP 122/72   Pulse 63   Temp 97.8 F (36.6 C)   Ht '5\' 3"'$  (1.6 m)   Wt 182 lb 6.4 oz (82.7 kg)   SpO2 97%   BMI 32.31 kg/m  Gen: NAD, resting comfortably HEENT: Mucous membranes are moist. Oropharynx normal Neck: no thyromegaly CV: RRR no murmurs rubs or gallops Lungs: CTAB no crackles, wheeze, rhonchi Abdomen: soft/nontender/nondistended/normal bowel sounds. No rebound or guarding.  Ext: no edema Skin: warm, dry Neuro: grossly normal,  moves all extremities, PERRLA   Assessment and Plan   77 y.o. female presenting for annual physical.  Health Maintenance counseling: 1. Anticipatory guidance: Patient counseled regarding regular dental exams -q6 months, eye exams - yearly,  avoiding smoking and second hand smoke , limiting alcohol to 1 beverage per day , no illicit drugs .   2. Risk factor reduction:  Advised patient of need for regular exercise and diet rich and fruits and vegetables to reduce risk of heart attack and stroke.  Exercise- has been harder due to foot pain- has seen ortho- encouraged stationary bike.  Diet/weight management-up 2 pounds from last physical- feels primarily activity related- feels she eats reasonably well.  Wt Readings from Last 3 Encounters:  02/15/22 182 lb 6.4 oz (82.7 kg)  08/15/21 180 lb 8 oz (81.9 kg)  07/05/21 184 lb (83.5 kg)  3. Immunizations/screenings/ancillary studies - declines Shingrix and further covid shots  Immunization History  Administered Date(s) Administered   Fluad Quad(high Dose 65+) 11/21/2018, 10/27/2019   Influenza Split 10/22/2012   Influenza Whole 01/22/2005, 10/23/2006, 10/22/2008, 10/22/2009   Influenza, High Dose Seasonal PF 11/05/2020   Influenza-Unspecified 10/22/2013, 11/09/2015, 10/31/2017   Moderna Sars-Covid-2 Vaccination 02/18/2019, 03/18/2019   Pneumococcal Polysaccharide-23 12/02/2007   Td 01/22/2001   Tetanus 02/17/2013  4. Cervical cancer screening- past age based screening recommendations. Never had an abnormal PAP Smear. Denies any vaginal bleeding or discharge. History of hysterectomy at age 17.  60. Breast cancer screening-  breast exam self exams monthly at home and mammogram done 11/14/2021  and yearly planned  6. Colon cancer screening - normal 03/04/2013 with plans for 10-year repeat to 12/12/2023.  Will be 78 and may not qualify per new guidelines- has not had polyps. No blood in stool.  7. Skin cancer screening- Follows with dermatologist in  [place] Dr. [place] Crane.  Advised regular sunscreen use. Denies worrisome, changing, or new skin lesions.  8. Birth control/STD check- Hysterectomy/postmenopausal and monogamous  9. Osteoporosis screening at 47- osteopenia noted in the past.  April 07, 2018 normal scores-patient is  continuing calcium and vitamin D-consider 5-year repeat  10. 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   #hyperlipidemia-LDL typically under 100 S: Medication:Atorvastatin 20 mg once a week Lab Results  Component Value Date   CHOL 207 (H) 02/09/2021   HDL 54.40 02/09/2021   LDLCALC 129 (H) 02/09/2021   LDLDIRECT 96.0 08/15/2021   TRIG 122.0 02/09/2021   CHOLHDL 4 02/09/2021   A/P: hopefully stable or improved- update lipid panel today. Continue current meds for now  # GERD S:Medication: Pepcid 20 mg A/P: stable- continue current medicines     #Orthopedic concerns- Dr. Durward Fortes has seen her in the past. Deals with OA left knee (R knee injection 2022 actually) and carpal tunnel surgery. trigger finger release 2023 -has also had some feet issues this year as well as ongoing back issues but overall stable- follows with ortho when needed  Recommended follow up: Return in about 6 months (around 08/16/2022) for followup or sooner if needed.Schedule b4 you leave. Future Appointments  Date Time Provider [place]  02/16/2022  1:00  PM [facility]   Lab/Order associations: fasting   ICD-10-CM   1. Preventative health care  Z00.00     2. Stage 3 chronic kidney disease, unspecified whether stage 3a or 3b CKD (HCC)  N18.30     3. Hyperlipidemia, unspecified hyperlipidemia type  E78.5 CBC with Differential/Platelet    Comprehensive metabolic panel    Lipid panel    4. Osteopenia of spine  M85.88       No orders of the defined types were placed in this encounter.   Return precautions advised.  [name], MD

## 2022-02-16 ENCOUNTER — Ambulatory Visit (INDEPENDENT_AMBULATORY_CARE_PROVIDER_SITE_OTHER): Payer: Medicare Other

## 2022-02-16 DIAGNOSIS — Z0000 Encounter for general adult medical examination without abnormal findings: Secondary | ICD-10-CM

## 2022-02-16 NOTE — Progress Notes (Signed)
I connected with  Noah Delaine on 02/16/22 by a audio enabled telemedicine application and verified that I am speaking with the correct person using two identifiers.  Patient Location: Home  Provider Location: Office/Clinic  I discussed the limitations of evaluation and management by telemedicine. The patient expressed understanding and agreed to proceed.  Subjective:   AERICA RINCON is a 77 y.o. female who presents for Medicare Annual (Subsequent) preventive examination.  Review of Systems     Cardiac Risk Factors include: advanced age (>48mn, >>22women);hypertension;dyslipidemia;obesity (BMI >30kg/m2)     Objective:    There were no vitals filed for this visit. There is no height or weight on file to calculate BMI.     02/16/2022    1:03 PM 02/03/2021    1:15 PM 01/29/2020    1:13 PM 12/31/2017    3:07 PM 04/02/2016    8:44 AM 06/02/2015    8:13 AM  Advanced Directives  Does Patient Have a Medical Advance Directive? No Yes Yes No No No  Type of AScientist, physiologicalof ASan BenitoLiving will     Does patient want to make changes to medical advance directive?  Yes (MAU/Ambulatory/Procedural Areas - Information given)      [place]?   No - copy requested     Would patient like information on creating a medical advance directive? No - Patient declined   No - Patient declined      Current Medications (verified) Outpatient Encounter Medications as of 02/16/2022  Medication Sig   atenolol-chlorthalidone (TENORETIC) 50-25 MG tablet TAKE 1/2 TABLET BY MOUTH DAILY   atorvastatin (LIPITOR) 20 MG tablet TAKE 1 TABLET(20 MG) BY MOUTH 1 TIME A WEEK   calcium citrate-vitamin D (CITRACAL+D) 315-200 MG-UNIT per tablet Take 1 tablet by mouth daily.   famotidine (PEPCID) 20 MG tablet Take 1 tablet (20 mg total) by mouth 2 (two) times daily. (Patient taking differently: Take 20 mg by mouth daily.)   fexofenadine (ALLEGRA) 180 MG tablet Take 180 mg  by mouth daily.   fluorometholone (FML) 0.1 % ophthalmic suspension SMARTSIG:In Eye(s)   fluticasone (FLONASE) 50 MCG/ACT nasal spray PLACE 2 SPRAYS IN EACH NOSTRIL DAILY   No facility-administered encounter medications on file as of 02/16/2022.    Allergies (verified) Penicillins   History: Past Medical History:  Diagnosis Date   Allergy    Arthritis    Carpal tunnel syndrome of left wrist    Carpal tunnel syndrome of right wrist    Chronic left shoulder pain 05/31/2011   Frozen shoulder history- did exercises and eventually resolved   Hypertension    Lichen planus 102/77/4128  Qualifier: Diagnosis of  By: TSherren MochaMD, JJory Ee   Past Surgical History:  Procedure Laterality Date   CARPAL TUNNEL RELEASE     rt   CATARACT EXTRACTION W/ INTRAOCULAR LENS  IMPLANT, BILATERAL     PARATHYROIDECTOMY  01/22/2002   1 gland removed   PLANTAR FASCIA SURGERY  01/23/2003   left   TONSILLECTOMY     TRIGGER FINGER RELEASE Right    thumb- Dr. WDurward Fortesin 2023   VAGINAL HYSTERECTOMY     Family History  Problem Relation Age of Onset   Alzheimer's disease Mother    Leukemia Father    CVA Father    Hypertension Father    Hypertension Sister    Plantar fasciitis Daughter    Diverticulitis Son    Lymphoma  Maternal Grandmother    Early death Maternal Grandfather    Stroke Paternal Grandmother    Dementia Paternal Grandmother    Stroke Paternal Grandfather    Hypertension Paternal Grandfather    Colon cancer Neg Hx    Social History   Socioeconomic History   Marital status: Married    Spouse name: Not on file   Number of children: Not on file   Years of education: Not on file   Highest education level: Not on file  Occupational History   Occupation: retired   Tobacco Use   Smoking status: Never   Smokeless tobacco: Never  Vaping Use   Vaping Use: Never used  Substance and Sexual Activity   Alcohol use: No   Drug use: No   Sexual activity: Yes  Other Topics Concern   Not  on file  Social History Narrative   Married. Husband Gwyndolyn Saxon. 2 kids by first marriage. 2 step kids. 7 grandkids.       Retired Loss adjuster, [employer]   Social Determinants of Radio broadcast assistant Strain: [place]  (02/03/2021)   Overall Financial Resource Strain (CARDIA)    Difficulty of Paying Living Expenses: Not hard at all  Food Insecurity: No Food Insecurity (02/03/2021)   Hunger Vital Sign    Worried About Running Out of Food in the Last Year: Never true    [place] in the Last Year: Never true  Transportation Needs: No Transportation Needs (02/03/2021)   PRAPARE - Hydrologist (Medical): No    Lack of Transportation (Non-Medical): No  Physical Activity: Insufficiently Active (02/16/2022)   Exercise Vital Sign    Days of Exercise per Week: 4 days    Minutes of Exercise per Session: 30 min  Stress: No Stress Concern Present (02/16/2022)   [place]    Feeling of Stress : Not at all  Social Connections: Moderately Integrated (02/03/2021)   Social Connection and Isolation Panel [NHANES]    Frequency of Communication with Friends and Family: Twice a week    Frequency of Social Gatherings with Friends and Family: More than three times a week    Attends Religious Services: More than 4 times per year    Active Member of [employer] or Organizations: No    Attends Music therapist: Never    Marital Status: Married    Tobacco Counseling Counseling given: Not Answered   Clinical Intake:  Pre-visit preparation completed: Yes  Pain : No/denies pain     BMI - recorded: 32.31 Nutritional Status: BMI > 30  Obese Nutritional Risks: None Diabetes: No  How often do you need to have someone help you when you read instructions, pamphlets, or other written materials from your doctor or pharmacy?: 1 - Never  Diabetic?no  Interpreter Needed?:  No  Information entered by :: Charlott Rakes, LPN   Activities of Daily Living    02/16/2022    1:04 PM 02/12/2022    4:10 PM  In your present state of health, do you have any difficulty performing the following activities:  Hearing? 0 0  Vision? 0 0  Difficulty concentrating or making decisions? 0 0  Walking or climbing stairs? 0 0  Dressing or bathing? 0 0  Doing errands, shopping? 0 0  Preparing Food and eating ? N N  Using the Toilet? N N  In the past six months, have you accidently  leaked urine? N N  Do you have problems with loss of bowel control? N N  Managing your Medications? N N  Managing your Finances? N N  Housekeeping or managing your Housekeeping? N N    Patient Care Team: [name], MD as PCP - General (Family Medicine) Garrel Ridgel, DPM as Consulting Physician (Podiatry) Garald Balding, MD as Consulting Physician (Orthopedic Surgery) Danis, Kirke Corin, MD as Consulting Physician (Gastroenterology)  Indicate any recent Medical Services you may have received from other than [facility] providers in the past year (date may be approximate).     Assessment:   This is a routine wellness examination for Angline.  Hearing/Vision screen Hearing Screening - Comments:: Pt denies any hearing issues  Vision Screening - Comments:: Pt follows up with Dr [name] for annual eye exams   Dietary issues and exercise activities discussed: Current Exercise Habits: Home exercise routine, Type of exercise: walking;Other - see comments (recumbent bike), Time (Minutes): 30, Frequency (Times/Week): 4, Weekly Exercise (Minutes/Week): 120   Goals Addressed             This Visit's Progress    Patient Stated       Getting under  175 lbs this year        Depression Screen    02/16/2022    1:02 PM 02/15/2022    8:50 AM 02/09/2021    9:13 AM 02/03/2021    1:14 PM 07/27/2020    9:18 AM 01/29/2020    1:11 PM 07/08/2019    9:50 AM  PHQ 2/9 Scores  PHQ - 2 Score 0 0 0 0 0 0 0   PHQ- 9 Score 0 0 0    0    Fall Risk    02/16/2022    1:03 PM 02/12/2022    4:10 PM 02/03/2021    1:17 PM 07/27/2020    9:18 AM 01/29/2020    1:14 PM  [place] in the past year? 0 0 0 0 0  Number falls in past yr: 0  0 0 0  Injury with Fall? 0 0 0 0 0  Risk for fall due to : Impaired balance/gait;Impaired mobility;Impaired vision  Impaired vision No Fall Risks   Follow up Falls prevention discussed  Falls prevention discussed Falls evaluation completed Falls prevention discussed    FALL RISK PREVENTION PERTAINING TO THE HOME:  Any stairs in or around the home? Yes  If so, are there any without handrails? No  Home free of loose throw rugs in walkways, pet beds, electrical cords, etc? Yes  Adequate lighting in your home to reduce risk of falls? Yes   ASSISTIVE DEVICES UTILIZED TO PREVENT FALLS:  Life alert? No  Use of a cane, walker or w/c? No  Grab bars in the bathroom? Yes  Shower chair or bench in shower? No  Elevated toilet seat or a handicapped toilet? No   TIMED UP AND GO:  Was the test performed? No .   Cognitive Function:        02/16/2022    1:04 PM 02/03/2021    1:19 PM 01/29/2020    1:16 PM 12/31/2017    3:42 PM  6CIT Screen  What Year? 0 points 0 points 0 points 0 points  What month? 0 points 0 points 0 points 0 points  What time? 0 points 0 points  0 points  Count back from 20 0 points 0 points 0 points 0 points  Months  in reverse 0 points 0 points 0 points 0 points  Repeat phrase 0 points 0 points 0 points 0 points  Total Score 0 points 0 points  0 points    Immunizations Immunization History  Administered Date(s) Administered   Fluad Quad(high Dose 65+) 11/21/2018, 10/27/2019   Influenza Split 10/22/2012   Influenza Whole 01/22/2005, 10/23/2006, 10/22/2008, 10/22/2009   Influenza, High Dose Seasonal PF 11/05/2020   Influenza-Unspecified 10/22/2013, 11/09/2015, 10/31/2017   Moderna Sars-Covid-2 Vaccination 02/18/2019, 03/18/2019    Pneumococcal Polysaccharide-23 12/02/2007   Td 01/22/2001   Tetanus 02/17/2013    TDAP status: Up to date  Flu Vaccine status: Due, Education has been provided regarding the importance of this vaccine. Advised may receive this vaccine at local pharmacy or Health Dept. Aware to provide a copy of the vaccination record if obtained from local pharmacy or Health Dept. Verbalized acceptance and understanding.  Pneumococcal vaccine status: Up to date  Covid-19 vaccine status: Completed vaccines  Qualifies for Shingles Vaccine? Yes   Zostavax completed No   Shingrix Completed?: No.    Education has been provided regarding the importance of this vaccine. Patient has been advised to call insurance company to determine out of pocket expense if they have not yet received this vaccine. Advised may also receive vaccine at local pharmacy or Health Dept. Verbalized acceptance and understanding.  Screening Tests Health Maintenance  Topic Date Due   COVID-19 Vaccine (3 - Moderna risk series) 03/03/2022 (Originally 04/15/2019)   INFLUENZA VACCINE  04/22/2022 (Originally 08/22/2021)   Zoster Vaccines- Shingrix (1 of 2) 05/17/2022 (Originally 01/11/1965)   Pneumonia Vaccine 31+ Years old (2 - PCV) 02/16/2023 (Originally 01/12/2012)   Medicare Annual Wellness (AWV)  02/17/2023   DTaP/Tdap/Td (3 - Tdap) 02/18/2023   COLONOSCOPY (Pts 45-24yr Insurance coverage will need to be confirmed)  03/05/2023   DEXA SCAN  Completed   Hepatitis C Screening  Completed   HPV VACCINES  Aged Out    Health Maintenance  There are no preventive care reminders to display for this patient.   Colorectal cancer screening: Type of screening: Colonoscopy. Completed 03/04/13. Repeat every 10 years  Mammogram status: Completed 11/14/21. Repeat every year  Bone Density status: Completed 04/07/18. Results reflect: Bone density results: OSTEOPENIA. Repeat every 2 years.  Additional Screening:  Hepatitis C Screening:  Completed  04/02/16  Vision Screening: Recommended annual ophthalmology exams for early detection of glaucoma and other disorders of the eye. Is the patient up to date with their annual eye exam?  Yes  Who is the provider or what is the name of the office in which the patient attends annual eye exams? Dr CJorja Loa If pt is not established with a provider, would they like to be referred to a provider to establish care? No .   Dental Screening: Recommended annual dental exams for proper oral hygiene  Community Resource Referral / Chronic Care Management: CRR required this visit?  No   CCM required this visit?  No      Plan:     I have personally reviewed and noted the following in the patient's chart:   Medical and social history Use of alcohol, tobacco or illicit drugs  Current medications and supplements including opioid prescriptions. Patient is not currently taking opioid prescriptions. Functional ability and status Nutritional status Physical activity Advanced directives List of other physicians Hospitalizations, surgeries, and ER visits in previous 12 months Vitals Screenings to include cognitive, depression, and falls Referrals and appointments  In addition, I have  reviewed and discussed with patient certain preventive protocols, quality metrics, and best practice recommendations. A written personalized care plan for preventive services as well as general preventive health recommendations were provided to patient.     Willette Brace, LPN   1/61/0960   Nurse Notes: none

## 2022-02-16 NOTE — Patient Instructions (Signed)
Marie Ramirez , Thank you for taking time to come for your Medicare Wellness Visit. I appreciate your ongoing commitment to your health goals. Please review the following plan we discussed and let me know if I can assist you in the future.   These are the goals we discussed:  Goals      Exercise 150 min/wk Moderate Activity     Joining [employer] January 22, 2018. Plans to join the [place] [place] [employer]     Patient Stated     Stay healthy as I am      Patient Stated     Stay well      Patient Stated     Getting under  175 lbs this year      Weight (lb) < 180 lb (81.6 kg)     Check out  online nutrition programs as GumSearch.nl and http://vang.com/; fit61m;  Look for foods with "whole" wheat; bran; oatmeal etc Shot at the farmer's markets in season for fresher choices  Watch for "hydrogenated" on the label of oils which are trans-fats.  Watch for "high fructose corn syrup" in snacks, yogurt or ketchup  Meats have less marbling; bright colored fruits and vegetables;  Canned; dump out liquid and wash vegetables. Be mindful of what we are eating  Portion control is essential to a health weight! Sit down; take a break and enjoy your meal; take smaller bites; put the fork down between bites;  It takes 20 minutes to get full; so check in with your fullness cues and stop eating when you start to fill full               This is a list of the screening recommended for you and due dates:  Health Maintenance  Topic Date Due   COVID-19 Vaccine (3 - Moderna risk series) 03/03/2022*   Flu Shot  04/22/2022*   Zoster (Shingles) Vaccine (1 of 2) 05/17/2022*   Pneumonia Vaccine (2 - PCV) 02/16/2023*   Medicare Annual Wellness Visit  02/17/2023   DTaP/Tdap/Td vaccine (3 - Tdap) 02/18/2023   Colon Cancer Screening  03/05/2023   DEXA scan (bone density measurement)  Completed   Hepatitis C Screening: USPSTF Recommendation to screen - Ages 147-79yo.  Completed   HPV Vaccine  Aged Out   *Topic was postponed. The date shown is not the original due date.    Advanced directives: Advance directive discussed with you today. Even though you declined this today please call our office should you change your mind and we can give you the proper paperwork for you to fill out.  Conditions/risks identified: lose weight with the goal of getting under  175 LBS  Next appointment: Follow up in one year for your annual wellness visit    Preventive Care 65 Years and Older, Female Preventive care refers to lifestyle choices and visits with your health care provider that can promote health and wellness. What does preventive care include? A yearly physical exam. This is also called an annual well check. Dental exams once or twice a year. Routine eye exams. Ask your health care provider how often you should have your eyes checked. Personal lifestyle choices, including: Daily care of your teeth and gums. Regular physical activity. Eating a healthy diet. Avoiding tobacco and drug use. Limiting alcohol use. Practicing safe sex. Taking low-dose aspirin every day. Taking vitamin and mineral supplements as recommended by your health care provider. What happens during an annual well check? The services and screenings  done by your health care provider during your annual well check will depend on your age, overall health, lifestyle risk factors, and family history of disease. Counseling  Your health care provider may ask you questions about your: Alcohol use. Tobacco use. Drug use. Emotional well-being. Home and relationship well-being. Sexual activity. Eating habits. History of falls. Memory and ability to understand (cognition). Work and work Statistician. Reproductive health. Screening  You may have the following tests or measurements: Height, weight, and BMI. Blood pressure. Lipid and cholesterol levels. These may be checked every 5 years, or more frequently if you are over 58  years old. Skin check. Lung cancer screening. You may have this screening every year starting at age 36 if you have a 30-pack-year history of smoking and currently smoke or have quit within the past 15 years. Fecal occult blood test (FOBT) of the stool. You may have this test every year starting at age 17. Flexible sigmoidoscopy or colonoscopy. You may have a sigmoidoscopy every 5 years or a colonoscopy every 10 years starting at age 60. Hepatitis C blood test. Hepatitis B blood test. Sexually transmitted disease (STD) testing. Diabetes screening. This is done by checking your blood sugar (glucose) after you have not eaten for a while (fasting). You may have this done every 1-3 years. Bone density scan. This is done to screen for osteoporosis. You may have this done starting at age 43. Mammogram. This may be done every 1-2 years. Talk to your health care provider about how often you should have regular mammograms. Talk with your health care provider about your test results, treatment options, and if necessary, the need for more tests. Vaccines  Your health care provider may recommend certain vaccines, such as: Influenza vaccine. This is recommended every year. Tetanus, diphtheria, and acellular pertussis (Tdap, Td) vaccine. You may need a Td booster every 10 years. Zoster vaccine. You may need this after age 71. Pneumococcal 13-valent conjugate (PCV13) vaccine. One dose is recommended after age 79. Pneumococcal polysaccharide (PPSV23) vaccine. One dose is recommended after age 65. Talk to your health care provider about which screenings and vaccines you need and how often you need them. This information is not intended to replace advice given to you by your health care provider. Make sure you discuss any questions you have with your health care provider. Document Released: 02/04/2015 Document Revised: 09/28/2015 Document Reviewed: 11/09/2014 Elsevier Interactive Patient Education  2017  [place] Prevention in the Home Falls can cause injuries. They can happen to people of all ages. There are many things you can do to make your home safe and to help prevent falls. What can I do on the outside of my home? Regularly fix the edges of walkways and driveways and fix any cracks. Remove anything that might make you trip as you walk through a door, such as a raised step or threshold. Trim any bushes or trees on the path to your home. Use bright outdoor lighting. Clear any walking paths of anything that might make someone trip, such as rocks or tools. Regularly check to see if handrails are loose or broken. Make sure that both sides of any steps have handrails. Any raised decks and porches should have guardrails on the edges. Have any leaves, snow, or ice cleared regularly. Use sand or salt on walking paths during winter. Clean up any spills in your garage right away. This includes oil or grease spills. What can I do in the bathroom? Use night lights.  Install grab bars by the toilet and in the tub and shower. Do not use towel bars as grab bars. Use non-skid mats or decals in the tub or shower. If you need to sit down in the shower, use a plastic, non-slip stool. Keep the floor dry. Clean up any water that spills on the floor as soon as it happens. Remove soap buildup in the tub or shower regularly. Attach bath mats securely with double-sided non-slip rug tape. Do not have throw rugs and other things on the floor that can make you trip. What can I do in the bedroom? Use night lights. Make sure that you have a light by your bed that is easy to reach. Do not use any sheets or blankets that are too big for your bed. They should not hang down onto the floor. Have a firm chair that has side arms. You can use this for support while you get dressed. Do not have throw rugs and other things on the floor that can make you trip. What can I do in the kitchen? Clean up any spills  right away. Avoid walking on wet floors. Keep items that you use a lot in easy-to-reach places. If you need to reach something above you, use a strong step stool that has a grab bar. Keep electrical cords out of the way. Do not use floor polish or wax that makes floors slippery. If you must use wax, use non-skid floor wax. Do not have throw rugs and other things on the floor that can make you trip. What can I do with my stairs? Do not leave any items on the stairs. Make sure that there are handrails on both sides of the stairs and use them. Fix handrails that are broken or loose. Make sure that handrails are as long as the stairways. Check any carpeting to make sure that it is firmly attached to the stairs. Fix any carpet that is loose or worn. Avoid having throw rugs at the top or bottom of the stairs. If you do have throw rugs, attach them to the floor with carpet tape. Make sure that you have a light switch at the top of the stairs and the bottom of the stairs. If you do not have them, ask someone to add them for you. What else can I do to help prevent falls? Wear shoes that: Do not have high heels. Have rubber bottoms. Are comfortable and fit you well. Are closed at the toe. Do not wear sandals. If you use a stepladder: Make sure that it is fully opened. Do not climb a closed stepladder. Make sure that both sides of the stepladder are locked into place. Ask someone to hold it for you, if possible. Clearly mark and make sure that you can see: Any grab bars or handrails. First and last steps. Where the edge of each step is. Use tools that help you move around (mobility aids) if they are needed. These include: Canes. Walkers. Scooters. Crutches. Turn on the lights when you go into a dark area. Replace any light bulbs as soon as they burn out. Set up your furniture so you have a clear path. Avoid moving your furniture around. If any of your floors are uneven, fix them. If there are  any pets around you, be aware of where they are. Review your medicines with your doctor. Some medicines can make you feel dizzy. This can increase your chance of falling. Ask your doctor what other things that you  can do to help prevent falls. This information is not intended to replace advice given to you by your health care provider. Make sure you discuss any questions you have with your health care provider. Document Released: 11/04/2008 Document Revised: 06/16/2015 Document Reviewed: 02/12/2014 Elsevier Interactive Patient Education  2017 [employer].

## 2022-02-19 ENCOUNTER — Other Ambulatory Visit: Payer: Self-pay

## 2022-02-19 MED ORDER — ATORVASTATIN CALCIUM 20 MG PO TABS: 20.0000 mg | ORAL_TABLET | ORAL | 0 refills | Status: DC

## 2022-02-19 MED ORDER — ATENOLOL-CHLORTHALIDONE 50-25 MG PO TABS: ORAL_TABLET | ORAL | 3 refills | Status: DC

## 2022-08-09 ENCOUNTER — Other Ambulatory Visit: Payer: Self-pay | Admitting: Family Medicine

## 2022-08-09 MED ORDER — ATORVASTATIN CALCIUM 20 MG PO TABS: 20.0000 mg | ORAL_TABLET | ORAL | 0 refills | Status: DC

## 2022-08-16 ENCOUNTER — Encounter: Payer: Self-pay | Admitting: Family Medicine

## 2022-08-16 ENCOUNTER — Ambulatory Visit (INDEPENDENT_AMBULATORY_CARE_PROVIDER_SITE_OTHER): Payer: Medicare Other | Admitting: Family Medicine

## 2022-08-16 VITALS — BP 132/78 | HR 62 | Temp 97.7°F | Ht 63.0 in | Wt 181.6 lb

## 2022-08-16 DIAGNOSIS — I10 Essential (primary) hypertension: Secondary | ICD-10-CM

## 2022-08-16 DIAGNOSIS — N183 Chronic kidney disease, stage 3 unspecified: Secondary | ICD-10-CM

## 2022-08-16 DIAGNOSIS — E785 Hyperlipidemia, unspecified: Secondary | ICD-10-CM

## 2022-08-16 LAB — COMPREHENSIVE METABOLIC PANEL
ALT: 22 U/L (ref 0–35)
AST: 23 U/L (ref 0–37)
Albumin: 4.4 g/dL (ref 3.5–5.2)
Alkaline Phosphatase: 61 U/L (ref 39–117)
BUN: 28 mg/dL — ABNORMAL HIGH (ref 6–23)
CO2: 29 mEq/L (ref 19–32)
Calcium: 10.6 mg/dL — ABNORMAL HIGH (ref 8.4–10.5)
Chloride: 104 mEq/L (ref 96–112)
Creatinine, Ser: 1.32 mg/dL — ABNORMAL HIGH (ref 0.40–1.20)
GFR: 39.47 mL/min — ABNORMAL LOW (ref >60.00)
Glucose, Bld: 95 mg/dL (ref 70–99)
Potassium: 4.3 mEq/L (ref 3.5–5.1)
Sodium: 142 mEq/L (ref 135–145)
Total Bilirubin: 0.8 mg/dL (ref 0.2–1.2)
Total Protein: 7.8 g/dL (ref 6.0–8.3)

## 2022-08-16 LAB — LDL CHOLESTEROL, DIRECT: Direct LDL: 106 mg/dL

## 2022-08-16 MED ORDER — ATENOLOL-CHLORTHALIDONE 50-25 MG PO TABS: ORAL_TABLET | ORAL | 3 refills | Status: DC

## 2022-08-16 MED ORDER — ATORVASTATIN CALCIUM 20 MG PO TABS: ORAL_TABLET | ORAL | 3 refills | Status: DC

## 2022-08-16 NOTE — Progress Notes (Signed)
Phone [phone] In person visit   Subjective:   Marie Ramirez is a 77 y.o. year old very pleasant female patient who presents for/with See problem oriented charting Chief Complaint  Patient presents with   Medical Management of Chronic Issues   Hyperlipidemia   Hypertension    Past Medical History-  Patient Active Problem List   Diagnosis Date Noted   CKD (chronic kidney disease), stage III (HCC) 12/25/2017    Priority: Medium    Hyperlipidemia 04/04/2016    Priority: Medium    GERD (gastroesophageal reflux disease) 05/09/2015    Priority: Medium    Vaginitis, atrophic 02/24/2014    Priority: Medium    Hematuria 12/17/2006    Priority: Medium    Essential hypertension 09/06/2006    Priority: Medium    Carpal tunnel syndrome, left upper limb 10/16/2017    Priority: Low   Osteopenia 03/15/2014    Priority: Low   Obesity 05/31/2011    Priority: Low   Allergic rhinitis 09/06/2006    Priority: Low   Pain in right foot 11/07/2021    Priority: 1.   Trigger thumb, right thumb 07/05/2021    Priority: 1.   Osteoarthritis of left knee 02/20/2016    Priority: 1.    Medications- reviewed and updated Current Outpatient Medications  Medication Sig Dispense Refill   atenolol-chlorthalidone (TENORETIC) 50-25 MG tablet TAKE 1/2 TABLET BY MOUTH DAILY 46 tablet 3   atorvastatin (LIPITOR) 20 MG tablet Take 1 tablet (20 mg total) by mouth 2 (two) times a week. 26 tablet 0   calcium citrate-vitamin D (CITRACAL+D) 315-200 MG-UNIT per tablet Take 1 tablet by mouth daily.     famotidine (PEPCID) 20 MG tablet Take 1 tablet (20 mg total) by mouth 2 (two) times daily. (Patient taking differently: Take 20 mg by mouth daily.) 180 tablet 3   fexofenadine (ALLEGRA) 180 MG tablet Take 180 mg by mouth daily.     fluorometholone (FML) 0.1 % ophthalmic suspension SMARTSIG:In Eye(s)     fluticasone (FLONASE) 50 MCG/ACT nasal spray PLACE 2 SPRAYS IN EACH NOSTRIL DAILY 32 g 5   atorvastatin  (LIPITOR) 20 MG tablet TAKE 1 TABLET(20 MG) BY MOUTH 2 TIMES A WEEK 26 tablet 0   No current facility-administered medications for this visit.     Objective:  BP 132/78   Pulse 62   Temp 97.7 F (36.5 C)   Ht 5\' 3"  (1.6 m)   Wt 181 lb 9.6 oz (82.4 kg)   SpO2 95%   BMI 32.17 kg/m  Gen: NAD, resting comfortably CV: RRR no murmurs rubs or gallops Lungs: CTAB no crackles, wheeze, rhonchi Ext: no edema Skin: warm, dry    Assessment and Plan   #hypertension #CKD III S: medication: atenolol chlorthalidone 50-25mg - half tablet -typically GFR in 40s range- knows tylenol only- avoid nsaids - has also tried Voltaren gel for her feet- a lot of issues this year- has cut down on her walking- is able to do stationary bike and some swimming BP Readings from Last 3 Encounters:  08/16/22 132/78  02/15/22 122/72  08/15/21 130/74  A/P: Hypertension- stable-/controlled per [facility] continue current medicines   CKD III- hopefully stable- update cmp today. Continue without meds for now - continue to monitor   #hyperlipidemia-LDL typically under 100 but up some in 2024 so tweaked medicine S: Medication:Atorvastatin 20 mg twice a week -Does get some cramping in the back of her legs at times but believes predates medications- did worsen on  twice a week but bearable Lab Results  Component Value Date   CHOL 193 02/15/2022   HDL 50.80 02/15/2022   LDLCALC 115 (H) 02/15/2022   LDLDIRECT 96.0 08/15/2021   TRIG 136.0 02/15/2022   CHOLHDL 4 02/15/2022  A/P: lipids hopefully improved- update LDL today- continue current medications- with muscle aches hesitant to increase further   # GERD S:Medication: Pepcid 20 mg in the morning usually controls all day A/P: stable- continue current medicines   Recommended follow up: Return in about 6 months (around 02/16/2023) for physical or sooner if needed.Schedule b4 you leave. Future Appointments  Date Time Provider Department Center  02/25/2023  1:30 PM [facility]  ANNUAL WELLNESS VISIT 1 [facility]   Lab/Order associations:   ICD-10-CM   1. Essential hypertension  I10 Comprehensive metabolic panel    2. Hyperlipidemia, unspecified hyperlipidemia type  E78.5 Comprehensive metabolic panel    LDL cholesterol, direct    3. Stage 3 chronic kidney disease, unspecified whether stage 3a or 3b CKD (HCC)  N18.30      No orders of the defined types were placed in this encounter.  Return precautions advised.  Tana Conch, MD

## 2022-08-16 NOTE — Patient Instructions (Addendum)
Let us know if you get a COVID or flu vaccine this fall at your pharmacy.  Please stop by lab before you go If you have mychart- we will send your results within 3 business days of [place] receiving them.  If you do not have mychart- we will call you about results within 5 business days of [place] receiving them.  *please also note that you will see labs on mychart as soon as they post. I will later go in and write notes on them- will say "notes from Dr. Durene Cal"   Recommended follow up: Return in about 6 months (around 02/16/2023) for physical or sooner if needed.Schedule b4 you leave.

## 2022-11-20 DIAGNOSIS — Z1231 Encounter for screening mammogram for malignant neoplasm of breast: Secondary | ICD-10-CM | POA: Diagnosis not present

## 2022-11-20 LAB — HM MAMMOGRAPHY

## 2022-11-22 ENCOUNTER — Encounter: Payer: Self-pay | Admitting: Family Medicine

## 2023-01-31 DIAGNOSIS — H04123 Dry eye syndrome of bilateral lacrimal glands: Secondary | ICD-10-CM | POA: Diagnosis not present

## 2023-02-18 ENCOUNTER — Ambulatory Visit: Payer: Self-pay | Admitting: Family Medicine

## 2023-02-18 ENCOUNTER — Other Ambulatory Visit: Payer: Self-pay | Admitting: Physician Assistant

## 2023-02-18 NOTE — Telephone Encounter (Signed)
Copied from CRM [id number]. Topic: Clinical - Medication Refill >> Feb 18, 2023  9:22 AM Leavy Cella D wrote: Most Recent Primary Care Visit:  Provider: Shelva Majestic  Department: [facility]  Visit Type: OFFICE VISIT  Date: 08/16/2022  Medication: methylPREDNISolone (MEDROL DOSEPAK) 4 MG TBPK tablet   Has the patient contacted their pharmacy? No (Agent: If no, request that the patient contact the pharmacy for the refill. If patient does not wish to contact the pharmacy document the reason why and proceed with request.) (Agent: If yes, when and what did the pharmacy advise?)  Is this the correct pharmacy for this prescription? Yes If no, delete pharmacy and type the correct one.  This is the patient's preferred pharmacy:  [id number] - [place], [place] - [address]  [place] [address] Phone: [phone] Fax: [phone]   Has the prescription been filled recently? No  Is the patient out of the medication? Yes  Has the patient been seen for an appointment in the last year OR does the patient have an upcoming appointment? Yes  Can we respond through MyChart? No  Agent: Please be advised that Rx refills may take up to 3 business days. We ask that you follow-up with your pharmacy.

## 2023-02-18 NOTE — Telephone Encounter (Signed)
Copied from CRM [id number]. Topic: Clinical - Red Word Triage >> Feb 18, 2023  9:28 AM Leavy Cella D wrote: Red Word that prompted transfer to Nurse Triage: extreme foot pain , unable to walk  Chief Complaint: Foot pain Symptoms: Pain and swelling Frequency: Chronic, flare-up last Sunday Pertinent Negatives: Patient denies relief Disposition: [] ED /[] Urgent Care (no appt availability in office) / [x] Appointment(In office/virtual)/ []  [facility]/ [] Home Care/ [] Refused Recommended Disposition /[] [facility]/ []  Follow-up with PCP Additional Notes: Patient called in to report a flare-up of pain in her left foot. Patient stated she has chronic arthritis in both of her feet. Patient stated that the pain flared-up after a walk in the cold last Sunday. Patient stated she is unable to put weight on the foot and has to limp around. Patient stated swelling is present on the top of the foot. Patient has been using ice and Tylenol, but denies relief. Patient stated she has taken prednisone in the past for flare-ups and it has helped. Advised patient to be seen in the office. Scheduled patient for tomorrow morning with PCP. Advised patient to call back if symptoms worsen or if she needs anything else. Patient complied.   Reason for Disposition  Foot pain is a chronic symptom (recurrent or ongoing AND present > 4 weeks)  Answer Assessment - Initial Assessment Questions 1. ONSET: "When did the pain start?"      Last Sunday  2. LOCATION: "Where is the pain located?"      Left foot  3. PAIN: "How bad is the pain?"    (Scale 1-10; or mild, moderate, severe)  - MILD (1-3): doesn't interfere with normal activities.   - MODERATE (4-7): interferes with normal activities (e.g., work or school) or awakens from sleep, limping.   - SEVERE (8-10): excruciating pain, unable to do any normal activities, unable to walk.      Rates pain at an 8, states she cannot put weight on it when walking  4. WORK  OR EXERCISE: "Has there been any recent work or exercise that involved this part of the body?"      States a walk in cold may have made it flare-up  5. CAUSE: "What do you think is causing the foot pain?"     Arthritis  6. OTHER SYMPTOMS: "Do you have any other symptoms?" (e.g., leg pain, rash, fever, numbness)     Swelling on top of the foot, ice has helped with the swelling  Protocols used: Foot Pain-A-AH

## 2023-02-19 ENCOUNTER — Ambulatory Visit (INDEPENDENT_AMBULATORY_CARE_PROVIDER_SITE_OTHER): Payer: Medicare Other | Admitting: Family Medicine

## 2023-02-19 ENCOUNTER — Encounter: Payer: Self-pay | Admitting: Family Medicine

## 2023-02-19 VITALS — BP 142/74 | HR 62 | Temp 98.4°F | Ht 63.0 in | Wt 180.0 lb

## 2023-02-19 DIAGNOSIS — I10 Essential (primary) hypertension: Secondary | ICD-10-CM

## 2023-02-19 DIAGNOSIS — M79672 Pain in left foot: Secondary | ICD-10-CM | POA: Diagnosis not present

## 2023-02-19 MED ORDER — METHYLPREDNISOLONE 4 MG PO TBPK: ORAL_TABLET | ORAL | 0 refills | Status: DC

## 2023-02-19 NOTE — Progress Notes (Signed)
Phone [phone] In person visit   Subjective:   Marie Ramirez is a 78 y.o. year old very pleasant female patient who presents for/with See problem oriented charting Chief Complaint  Patient presents with   Foot Pain    Pt c/o left foot pain after walking with her sister that started last Sunday. States its an arthritis flare up. (Did not want to address HM due to pain and being back soon for a reg ov)    Past Medical History-  Patient Active Problem List   Diagnosis Date Noted   CKD (chronic kidney disease), stage III (HCC) 12/25/2017    Priority: Medium    Hyperlipidemia 04/04/2016    Priority: Medium    GERD (gastroesophageal reflux disease) 05/09/2015    Priority: Medium    Vaginitis, atrophic 02/24/2014    Priority: Medium    Hematuria 12/17/2006    Priority: Medium    Essential hypertension 09/06/2006    Priority: Medium    Carpal tunnel syndrome, left upper limb 10/16/2017    Priority: Low   Osteopenia 03/15/2014    Priority: Low   Obesity 05/31/2011    Priority: Low   Allergic rhinitis 09/06/2006    Priority: Low   Pain in right foot 11/07/2021    Priority: 1.   Trigger thumb, right thumb 07/05/2021    Priority: 1.   Osteoarthritis of left knee 02/20/2016    Priority: 1.    Medications- reviewed and updated Current Outpatient Medications  Medication Sig Dispense Refill   atenolol-chlorthalidone (TENORETIC) 50-25 MG tablet TAKE 1/2 TABLET BY MOUTH DAILY 46 tablet 3   atorvastatin (LIPITOR) 20 MG tablet TAKE 1 TABLET(20 MG) BY MOUTH 2 TIMES A WEEK 26 tablet 3   calcium citrate-vitamin D (CITRACAL+D) 315-200 MG-UNIT per tablet Take 1 tablet by mouth daily.     famotidine (PEPCID) 20 MG tablet Take 1 tablet (20 mg total) by mouth 2 (two) times daily. (Patient taking differently: Take 20 mg by mouth daily.) 180 tablet 3   fexofenadine (ALLEGRA) 180 MG tablet Take 180 mg by mouth daily.     fluorometholone (FML) 0.1 % ophthalmic suspension SMARTSIG:In Eye(s)      fluticasone (FLONASE) 50 MCG/ACT nasal spray PLACE 2 SPRAYS IN EACH NOSTRIL DAILY 32 g 5   methylPREDNISolone (MEDROL DOSEPAK) 4 MG TBPK tablet Take as directed 21 tablet 0   No current facility-administered medications for this visit.     Objective:  BP (!) 142/74   Pulse 62   Temp 98.4 F (36.9 C)   Ht 5\' 3"  (1.6 m)   Wt 180 lb (81.6 kg)   SpO2 98%   BMI 31.89 kg/m  Gen: NAD, resting comfortably Left midfoot anterior and medially with erythema, warmth, tenderness possibly at the tarsometatarsal joints    Assessment and Plan   #social update- sister with melanoma in brain- big workup to find original location  # Left foot pain S: Patient was walking with her sister last Sunday the 19th.  Noted pain in her left foot after walking.  She is concerned this could be a flareup of arthritis. Has tried Voltaren and tried icing it and staying off of it. Was on foot again all day Sunday and started throbbing again - midfoot was warm to touch at that point and swollen- she called Dr. Cleophas Dunker but he is retired -Does have some history of arthritis in the right foot on x-rays 11/07/2021 in the midfoot.  Including bone spurs. She states this is  the same pain as the opposite foot- has had similar issues in both feet.  Pain can be severe and debilitating - in past prednisone has helped her- actually medro dose pack 11/07/21 -slightly better today -This is gout may consider uric acid lowering agents  A/P:  I suspect this could be gout or pseudogout or arthritis flare up - steroids can help any of these and I sent these in- get your first dose in as soon as you can today. Since we see you back in about 2 weeks we will check uric acid.  -Recurrent nature of these flares as well as current flare with warmth, erythema, tenderness, edema makes me think gout is likely -call me if not significantly better within 2-3 days and we can get an x-ray to rule out fracture but you preferred to hold off  today -Has to avoid NSAIDs orally with CKD stage III  #hypertension S: medication: atenolol chlorthalidone 50-25mg - half tablet Home readings #s: Reports typically well-controlled but has not checked recently BP Readings from Last 3 Encounters:  02/19/23 (!) 142/74  08/16/22 132/78  02/15/22 122/72   A/P: Hypertension-mild poor control-discussed she can take an extra half tablet so a full tablet of the atenolol chlorthalidone if poorly controlled at home but I suspect elevated blood pressures due to pain in her foot-recheck in 2 weeks at her regular follow-up  Recommended follow up: Return for next already scheduled visit or sooner if needed. Future Appointments  Date Time Provider Department Center  02/25/2023  1:40 PM [facility] ANNUAL WELLNESS VISIT 1 [facility]  03/06/2023  8:00 AM Durene Cal Aldine Contes, MD [facility]    Lab/Order associations:   ICD-10-CM   1. Left foot pain  M79.672     2. Essential hypertension  I10       Meds ordered this encounter  Medications   methylPREDNISolone (MEDROL DOSEPAK) 4 MG TBPK tablet    Sig: Take as directed    Dispense:  21 tablet    Refill:  0    Return precautions advised.  Tana Conch, MD

## 2023-02-19 NOTE — Patient Instructions (Addendum)
I suspect this could be gout or pseudogout or arthritis flare up - steroids can help any of these and I sent these in- get your first dose in as soon as you can today. Since we see you back in about 2 weeks we will check uric acid.  -call me if not significantly better within 2-3 days and we can get an x-ray to rule out fracture but you preferred to hold off today  Blood pressure hair high but could be due to pain- recheck at next visit- if high at home you could take a full tablet of atenolol chlorthalidone instead of half -check some home readings and bring those with you  Recommended follow up: Return for next already scheduled visit or sooner if needed.

## 2023-03-06 ENCOUNTER — Encounter: Payer: Self-pay | Admitting: Family Medicine

## 2023-03-06 ENCOUNTER — Ambulatory Visit (INDEPENDENT_AMBULATORY_CARE_PROVIDER_SITE_OTHER): Payer: Medicare Other | Admitting: Family Medicine

## 2023-03-06 ENCOUNTER — Ambulatory Visit: Payer: Self-pay | Admitting: Family Medicine

## 2023-03-06 VITALS — BP 105/80 | HR 62 | Temp 97.9°F | Ht 63.0 in | Wt 177.8 lb

## 2023-03-06 DIAGNOSIS — I10 Essential (primary) hypertension: Secondary | ICD-10-CM | POA: Diagnosis not present

## 2023-03-06 DIAGNOSIS — M79672 Pain in left foot: Secondary | ICD-10-CM

## 2023-03-06 DIAGNOSIS — Z131 Encounter for screening for diabetes mellitus: Secondary | ICD-10-CM

## 2023-03-06 DIAGNOSIS — Z0000 Encounter for general adult medical examination without abnormal findings: Secondary | ICD-10-CM | POA: Diagnosis not present

## 2023-03-06 DIAGNOSIS — E66811 Obesity, class 1: Secondary | ICD-10-CM

## 2023-03-06 DIAGNOSIS — E6609 Other obesity due to excess calories: Secondary | ICD-10-CM

## 2023-03-06 DIAGNOSIS — E785 Hyperlipidemia, unspecified: Secondary | ICD-10-CM | POA: Diagnosis not present

## 2023-03-06 DIAGNOSIS — M8588 Other specified disorders of bone density and structure, other site: Secondary | ICD-10-CM | POA: Diagnosis not present

## 2023-03-06 DIAGNOSIS — N183 Chronic kidney disease, stage 3 unspecified: Secondary | ICD-10-CM | POA: Diagnosis not present

## 2023-03-06 LAB — LIPID PANEL
Cholesterol: 185 mg/dL (ref 0–200)
HDL: 47.9 mg/dL (ref >39.00)
LDL Cholesterol: 103 mg/dL — ABNORMAL HIGH (ref 0–99)
NonHDL: 136.75
Total CHOL/HDL Ratio: 4
Triglycerides: 171 mg/dL — ABNORMAL HIGH (ref 0.0–149.0)
VLDL: 34.2 mg/dL (ref 0.0–40.0)

## 2023-03-06 LAB — COMPREHENSIVE METABOLIC PANEL
ALT: 28 U/L (ref 0–35)
AST: 31 U/L (ref 0–37)
Albumin: 4.1 g/dL (ref 3.5–5.2)
Alkaline Phosphatase: 62 U/L (ref 39–117)
BUN: 23 mg/dL (ref 6–23)
CO2: 29 meq/L (ref 19–32)
Calcium: 9.5 mg/dL (ref 8.4–10.5)
Chloride: 102 meq/L (ref 96–112)
Creatinine, Ser: 1.21 mg/dL — ABNORMAL HIGH (ref 0.40–1.20)
GFR: 43.65 mL/min — ABNORMAL LOW (ref >60.00)
Glucose, Bld: 106 mg/dL — ABNORMAL HIGH (ref 70–99)
Potassium: 3.9 meq/L (ref 3.5–5.1)
Sodium: 139 meq/L (ref 135–145)
Total Bilirubin: 0.5 mg/dL (ref 0.2–1.2)
Total Protein: 7.4 g/dL (ref 6.0–8.3)

## 2023-03-06 LAB — CBC WITH DIFFERENTIAL/PLATELET
Basophils Absolute: 0.1 10*3/uL (ref 0.0–0.1)
Basophils Relative: 1.2 % (ref 0.0–3.0)
Eosinophils Absolute: 0.1 10*3/uL (ref 0.0–0.7)
Eosinophils Relative: 1.9 % (ref 0.0–5.0)
HCT: 45.4 % (ref 36.0–46.0)
Hemoglobin: 15.4 g/dL — ABNORMAL HIGH (ref 12.0–15.0)
Lymphocytes Relative: 20.9 % (ref 12.0–46.0)
Lymphs Abs: 1.3 10*3/uL (ref 0.7–4.0)
MCHC: 34 g/dL (ref 30.0–36.0)
MCV: 90.3 fL (ref 78.0–100.0)
Monocytes Absolute: 0.6 10*3/uL (ref 0.1–1.0)
Monocytes Relative: 9.4 % (ref 3.0–12.0)
Neutro Abs: 4.3 10*3/uL (ref 1.4–7.7)
Neutrophils Relative %: 66.6 % (ref 43.0–77.0)
Platelets: 337 10*3/uL (ref 150.0–400.0)
RBC: 5.03 Mil/uL (ref 3.87–5.11)
RDW: 12.4 % (ref 11.5–15.5)
WBC: 6.4 10*3/uL (ref 4.0–10.5)

## 2023-03-06 LAB — URIC ACID: Uric Acid, Serum: 9.3 mg/dL — ABNORMAL HIGH (ref 2.4–7.0)

## 2023-03-06 LAB — HEMOGLOBIN A1C: Hgb A1c MFr Bld: 5.6 % (ref 4.6–6.5)

## 2023-03-06 MED ORDER — COLCHICINE 0.6 MG PO TABS: 0.3000 mg | ORAL_TABLET | Freq: Every day | ORAL | 1 refills | Status: DC

## 2023-03-06 MED ORDER — ALLOPURINOL 100 MG PO TABS: 100.0000 mg | ORAL_TABLET | Freq: Every day | ORAL | 6 refills | Status: DC

## 2023-03-06 NOTE — Progress Notes (Signed)
Phone [phone]   Subjective:  Patient presents today for their annual physical. Chief complaint-noted.   See problem oriented charting- ROS- full  review of systems was completed and negative except for: foot pain  The following were reviewed and entered/updated in epic: Past Medical History:  Diagnosis Date   Allergy    Arthritis    Carpal tunnel syndrome of left wrist    Carpal tunnel syndrome of right wrist    Chronic kidney disease 2019   Chronic left shoulder pain 05/31/2011   Frozen shoulder history- did exercises and eventually resolved   GERD (gastroesophageal reflux disease) 2017   Controlled medication   Hyperlipidemia 2019   Better with medicatio   Hypertension    Lichen planus 12/21/2008   Qualifier: Diagnosis of  By: Tawanna Cooler MD, Eugenio Hoes    Patient Active Problem List   Diagnosis Date Noted   CKD (chronic kidney disease), stage III (HCC) 12/25/2017    Priority: Medium    Hyperlipidemia 04/04/2016    Priority: Medium    GERD (gastroesophageal reflux disease) 05/09/2015    Priority: Medium    Vaginitis, atrophic 02/24/2014    Priority: Medium    Hematuria 12/17/2006    Priority: Medium    Essential hypertension 09/06/2006    Priority: Medium    Carpal tunnel syndrome, left upper limb 10/16/2017    Priority: Low   Osteopenia 03/15/2014    Priority: Low   Obesity 05/31/2011    Priority: Low   Allergic rhinitis 09/06/2006    Priority: Low   Pain in right foot 11/07/2021    Priority: 1.   Trigger thumb, right thumb 07/05/2021    Priority: 1.   Osteoarthritis of left knee 02/20/2016    Priority: 1.   Past Surgical History:  Procedure Laterality Date   CARPAL TUNNEL RELEASE     rt   CATARACT EXTRACTION W/ INTRAOCULAR LENS  IMPLANT, BILATERAL     EYE SURGERY  2012   Cataracts both eyes   PARATHYROIDECTOMY  01/22/2002   1 gland removed   PLANTAR FASCIA SURGERY  01/23/2003   left   TONSILLECTOMY     TRIGGER FINGER RELEASE Right    thumb- Dr.  Cleophas Dunker in 2023   VAGINAL HYSTERECTOMY      Family History  Problem Relation Age of Onset   Alzheimer's disease Mother    Arthritis Mother    Hearing loss Mother    Hyperlipidemia Mother    Vision loss Mother    Varicose Veins Mother    Leukemia Father    CVA Father    Hypertension Father    Cancer Father    Stroke Father    Hypertension Sister    Melanoma Sister        in brain age 79   Cancer Sister    Lymphoma Maternal Grandmother    Arthritis Maternal Grandmother    Cancer Maternal Grandmother    Hearing loss Maternal Grandmother    Early death Maternal Grandfather    Cancer Maternal Grandfather    Stroke Paternal Grandmother    Dementia Paternal Grandmother    Stroke Paternal Grandfather    Hypertension Paternal Grandfather    Hearing loss Paternal Grandfather    Plantar fasciitis Daughter    Diverticulitis Son    Colon cancer Neg Hx     Medications- reviewed and updated Current Outpatient Medications  Medication Sig Dispense Refill   atenolol-chlorthalidone (TENORETIC) 50-25 MG tablet TAKE 1/2 TABLET BY MOUTH DAILY 46 tablet  3   atorvastatin (LIPITOR) 20 MG tablet TAKE 1 TABLET(20 MG) BY MOUTH 2 TIMES A WEEK 26 tablet 3   calcium citrate-vitamin D (CITRACAL+D) 315-200 MG-UNIT per tablet Take 1 tablet by mouth daily.     famotidine (PEPCID) 20 MG tablet Take 1 tablet (20 mg total) by mouth 2 (two) times daily. (Patient taking differently: Take 20 mg by mouth daily.) 180 tablet 3   fexofenadine (ALLEGRA) 180 MG tablet Take 180 mg by mouth daily.     fluorometholone (FML) 0.1 % ophthalmic suspension SMARTSIG:In Eye(s)     fluticasone (FLONASE) 50 MCG/ACT nasal spray PLACE 2 SPRAYS IN EACH NOSTRIL DAILY 32 g 5   No current facility-administered medications for this visit.    Allergies-reviewed and updated Allergies  Allergen Reactions   Penicillins Rash    REACTION: Rash    Social History   Social History Narrative   Married. Husband Chrissie Noa. 2 kids by  first marriage. 2 step kids. 7 grandkids. 2nd greatgrandchild due November 2024.       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 Oropharynx normal Neck: no thyromegaly CV: RRR no murmurs rubs or gallops Lungs: CTAB no crackles, wheeze, rhonchi Abdomen: soft/nontender/nondistended/normal bowel sounds. No rebound or guarding.  Ext: no edema Skin: warm, dry Neuro: grossly normal, moves all extremities, PERRLA   Assessment and Plan   78 y.o. female presenting for annual physical.  Health Maintenance counseling: 1. Anticipatory guidance: Patient counseled regarding regular dental exams -q6 months, eye exams -yearly,  avoiding smoking and second hand smoke , limiting alcohol to 1 beverage per day- doesn't drink , no illicit drugs .   2. Risk factor reduction:  Advised patient of need for regular exercise and diet rich and fruits and vegetables to reduce risk of heart attack and stroke.  Exercise- if feet are bothering her can walk- also encouraged stationary bike.  Diet/weight management-down 5 lbs from last physical- trying to improve diet.  Wt Readings from Last 3 Encounters:  03/06/23 177 lb 12.8 oz (80.6 kg)  02/19/23 180 lb (81.6 kg)  08/16/22 181 lb 9.6 oz (82.4 kg)   3. Immunizations/screenings/ancillary studies- opts out flu, COVID, shingrix, Tetanus, Diphtheria, and Pertussis (Tdap), Prevnar for now- trying to help sister and worried about reaction Immunization History  Administered Date(s) Administered   Fluad Quad(high Dose 65+) 11/21/2018, 10/27/2019   Influenza Split 10/22/2012   Influenza  Whole 01/22/2005, 10/23/2006, 10/22/2008, 10/22/2009   Influenza, High Dose Seasonal PF 11/05/2020   Influenza-Unspecified 10/22/2013, 11/09/2015, 10/31/2017   Moderna Sars-Covid-2 Vaccination 02/18/2019, 03/18/2019   Pneumococcal Polysaccharide-23 12/02/2007   Td 01/22/2001   Tetanus 02/17/2013  4. Cervical cancer screening- past age based screening recommendations. Never had an abnormal PAP Smear. Denies any vaginal bleeding or discharge. History of hysterectomy at age 37.  5. Breast cancer screening-  breast exam self exams monthly at home and mammogram done 11/20/23  and yearly planned . Grandmother with breast cancer later in life.  6. Colon cancer screening - normal 03/04/2013 with plans  for 10-year repeat to 12/12/2023.  Will be close  65- has not had polyps. No blood in stool. likely hold off on repeat 7. Skin cancer screening- Follows with dermatologist in [place] Dr. Margo Aye.  Advised regular sunscreen use. Denies worrisome, changing, or new skin lesions.  8. Birth control/STD check- Hysterectomy/postmenopausal and monogamous - husband with erectile dysfunction (ED) issues though 9. Osteoporosis screening at 21- osteopenia noted in the past.  April 07, 2018 normal scores-patient is  continuing calcium and vitamin D-consider 5-year repeat  10. Smoking associated screening - never smoker  Status of chronic or acute concerns   #social udpate - sister with melanoma in brain- large stressor  #Left foot pain- finally improved after about 10 days on steroids- we will check uric acid to see if looks like gout today- possible allopurinol   #hypertension #CKD III S: medication: atenolol chlorthalidone 50-25mg - half tablet -typically GFR in 40s range- knows tylenol only- avoid nsaids in general Home readings #s: 120s to 130/70s at home usually- lower this morning A/P: Hypertension- well controlled continue current medications   CKD III- kidney function around 41 last check- encouraged  hydration- update CMP today -slightly high calcium last time- check today- takes calcium with vitamin D- may  change to vitamin D instead of D with calcium if still high  #hyperlipidemia-LDL typically under 100 S: Medication:Atorvastatin 20 mg 2x a week -Does get some cramping in the back of her legs at times but believes predates medications- did worsen twice a week but bearable Lab Results  Component Value Date   CHOL 193 02/15/2022   HDL 50.80 02/15/2022   LDLCALC 115 (H) 02/15/2022   LDLDIRECT 106.0 08/16/2022   TRIG 136.0 02/15/2022   CHOLHDL 4 02/15/2022  A/P: mildly elevated last year- hoping improved- check today- likely tolerate at least under 100- just at 106 last year   # GERD S:Medication: Pepcid 20 mg A/P: stable- continue current medicines     #Orthopedic concerns- knees better lately. Some foot pain and prior ankle pain -foot issues in general - plans to get back into Dr. Al Corpus if gout levels not elevated- mainly wants to focus on flare issues though  #Osteopenia history- noted in the past but normal scores April 07, 2018 other than -1.0 at lumbar spine - she is on calcium and vitamin D and we opted to repeat today  Recommended follow up: Return in about 6 months (around 09/03/2023) for followup or sooner if needed.Schedule b4 you leave. Future Appointments  Date Time Provider Department Center  03/21/2023  3:40 PM [facility] ANNUAL WELLNESS VISIT 1 [facility]   Lab/Order associations: fasting   ICD-10-CM   1. Preventative health care  Z00.00     2. Essential hypertension  I10     3. Hyperlipidemia, unspecified hyperlipidemia type  E78.5 Comprehensive metabolic panel    CBC with Differential/Platelet    Lipid panel    4. Screening for diabetes mellitus  Z13.1 Hemoglobin A1c    5. Class 1 obesity due to excess calories with serious comorbidity in adult, unspecified BMI  E66.811 Hemoglobin A1c   E66.09     6. Left foot pain  M79.672 Uric acid    7. Stage 3  chronic kidney disease, unspecified whether stage 3a or 3b CKD (HCC)  N18.30     8. Osteopenia of spine  M85.88 DG Bone Density     No orders of the defined types were placed in this encounter.  Return precautions advised.  Tana Conch,  MD

## 2023-03-06 NOTE — Patient Instructions (Addendum)
Schedule your bone density test at check out desk.  - located [address] across the street from [place] - in the basement - you DO NEED an appointment for the bone density tests.   Please stop by lab before you go If you have mychart- we will send your results within 3 business days of [place] receiving them.  If you do not have mychart- we will call you about results within 5 business days of [place] receiving them.  *please also note that you will see labs on mychart as soon as they post. I will later go in and write notes on them- will say "notes from Dr. Durene Cal"   Recommended follow up: Return in about 6 months (around 09/03/2023) for followup or sooner if needed.Schedule b4 you leave.

## 2023-03-06 NOTE — Telephone Encounter (Signed)
  Info only call. No triage. Pt missed call from office regarding labs. RN read values and PCP note. Pt would like to increase cholesterol medication to 3x/weekly and will need a refill. Pt understood plan for treating elevated uric acid levels. Pt stated she will pick up new medication tomorrow. RN advised pt to call back with any questions. Pt verbalized understanding.               Copied from CRM [id number]. Topic: Clinical - Lab/Test Results >> Mar 06, 2023  4:40 PM Alcus Dad wrote: Reason for CRM: Patient missed a call and is calling back to get lab results Reason for Disposition  General information question, no triage required and triager able to answer question  Answer Assessment - Initial Assessment Questions 1. REASON FOR CALL or QUESTION: "What is your reason for calling today?" or "How can I best help you?" or "What question do you have that I can help answer?"     Pt missed call from office regarding lab values.  Protocols used: Information Only Call - No Triage-A-AH

## 2023-03-06 NOTE — Addendum Note (Signed)
Addended by: Shelva Majestic on: 03/06/2023 12:35 PM   Modules accepted: Orders

## 2023-03-06 NOTE — Telephone Encounter (Signed)
Copied from CRM [id number]. Topic: Clinical - Lab/Test Results >> Mar 06, 2023  4:40 PM Alcus Dad wrote: Reason for CRM: Patient missed a call and is calling back to get lab results

## 2023-03-07 ENCOUNTER — Other Ambulatory Visit: Payer: Self-pay

## 2023-03-07 MED ORDER — ATORVASTATIN CALCIUM 20 MG PO TABS: 20.0000 mg | ORAL_TABLET | ORAL | 3 refills | Status: AC

## 2023-03-07 NOTE — Telephone Encounter (Signed)
Resutls have been reviewed with pt and new rx for Atorvastatin sent to the pharmacy.

## 2023-03-20 ENCOUNTER — Ambulatory Visit (INDEPENDENT_AMBULATORY_CARE_PROVIDER_SITE_OTHER)
Admission: RE | Admit: 2023-03-20 | Discharge: 2023-03-20 | Disposition: A | Discharge status: Home / Self Care | Payer: Medicare Other | Source: Ambulatory Visit | Attending: Family Medicine | Admitting: Family Medicine

## 2023-03-20 DIAGNOSIS — M8588 Other specified disorders of bone density and structure, other site: Secondary | ICD-10-CM

## 2023-03-21 ENCOUNTER — Ambulatory Visit: Payer: Medicare Other

## 2023-03-21 VITALS — Wt 177.0 lb

## 2023-03-21 DIAGNOSIS — Z0000 Encounter for general adult medical examination without abnormal findings: Secondary | ICD-10-CM | POA: Diagnosis not present

## 2023-03-21 NOTE — Patient Instructions (Signed)
 Marie Ramirez , Thank you for taking time to come for your Medicare Wellness Visit. I appreciate your ongoing commitment to your health goals. Please review the following plan we discussed and let me know if I can assist you in the future.   Referrals/Orders/Follow-Ups/Clinician Recommendations: Aim for 30 minutes of exercise or brisk walking, 6-8 glasses of water, and 5 servings of fruits and vegetables each day.   This is a list of the screening recommended for you and due dates:  Health Maintenance  Topic Date Due   COVID-19 Vaccine (3 - Moderna risk series) 04/15/2019   Flu Shot  04/22/2023*   Zoster (Shingles) Vaccine (1 of 2) 06/03/2023*   DTaP/Tdap/Td vaccine (3 - Tdap) 03/05/2024*   Pneumonia Vaccine (2 of 2 - PCV) 03/05/2024*   Mammogram  11/20/2023   Medicare Annual Wellness Visit  03/20/2024   DEXA scan (bone density measurement)  Completed   Hepatitis C Screening  Completed   HPV Vaccine  Aged Out   Colon Cancer Screening  Discontinued  *Topic was postponed. The date shown is not the original due date.    Advanced directives: (Declined) Advance directive discussed with you today. Even though you declined this today, please call our office should you change your mind, and we can give you the proper paperwork for you to fill out.  Next Medicare Annual Wellness Visit scheduled for next year: Yes

## 2023-03-21 NOTE — Progress Notes (Signed)
 Subjective:   Marie Ramirez is a 78 y.o. who presents for a Medicare Wellness preventive visit.  Visit Complete: Virtual I connected with  Marie Ramirez on 03/21/23 by a audio enabled telemedicine application and verified that I am speaking with the correct person using two identifiers.  Patient Location: Home  Provider Location: Office/Clinic  I discussed the limitations of evaluation and management by telemedicine. The patient expressed understanding and agreed to proceed.  Vital Signs: Because this visit was a virtual/telehealth visit, some criteria may be missing or patient reported. Any vitals not documented were not able to be obtained and vitals that have been documented are patient reported.  VideoDeclined- This patient declined Librarian, academic. Therefore the visit was completed with audio only.  AWV Questionnaire: Yes: Patient Medicare AWV questionnaire was completed by the patient on 03/20/23; I have confirmed that all information answered by patient is correct and no changes since this date.  Cardiac Risk Factors include: advanced age (>67men, >13 women);dyslipidemia;hypertension;obesity (BMI >30kg/m2)     Objective:    Today's Vitals   03/21/23 1528  Weight: 177 lb (80.3 kg)   Body mass index is 31.35 kg/m.     03/21/2023    3:36 PM 02/16/2022    1:03 PM 02/03/2021    1:15 PM 01/29/2020    1:13 PM 12/31/2017    3:07 PM 04/02/2016    8:44 AM 06/02/2015    8:13 AM  Advanced Directives  Does Patient Have a Medical Advance Directive? No No Yes Yes No No No  Type of Aeronautical engineer of [place];Living will     Does patient want to make changes to medical advance directive?   Yes (MAU/Ambulatory/Procedural Areas - Information given)      Copy of Healthcare Power of Attorney in Chart?    No - copy requested     Would patient like information on creating a medical advance directive? No - Patient declined No - Patient declined    No - Patient declined      Current Medications (verified) Outpatient Encounter Medications as of 03/21/2023  Medication Sig   allopurinol (ZYLOPRIM) 100 MG tablet Take 1 tablet (100 mg total) by mouth daily.   atenolol-chlorthalidone (TENORETIC) 50-25 MG tablet TAKE 1/2 TABLET BY MOUTH DAILY   atorvastatin (LIPITOR) 20 MG tablet Take 1 tablet (20 mg total) by mouth 3 (three) times a week.   calcium citrate-vitamin D (CITRACAL+D) 315-200 MG-UNIT per tablet Take 1 tablet by mouth daily.   colchicine 0.6 MG tablet Take 0.5 tablets (0.3 mg total) by mouth daily.   famotidine (PEPCID) 20 MG tablet Take 1 tablet (20 mg total) by mouth 2 (two) times daily. (Patient taking differently: Take 20 mg by mouth daily.)   fexofenadine (ALLEGRA) 180 MG tablet Take 180 mg by mouth daily.   fluorometholone (FML) 0.1 % ophthalmic suspension SMARTSIG:In Eye(s)   fluticasone (FLONASE) 50 MCG/ACT nasal spray PLACE 2 SPRAYS IN EACH NOSTRIL DAILY   No facility-administered encounter medications on file as of 03/21/2023.    Allergies (verified) Penicillins   History: Past Medical History:  Diagnosis Date   Allergy    Arthritis    Carpal tunnel syndrome of left wrist    Carpal tunnel syndrome of right wrist    Chronic kidney disease 2019   Chronic left shoulder pain 05/31/2011   Frozen shoulder history- did exercises and eventually resolved   GERD (gastroesophageal reflux disease) 2017  Controlled medication   Hyperlipidemia 2019   Better with medicatio   Hypertension    Lichen planus 12/21/2008   Qualifier: Diagnosis of  By: Tawanna Cooler MD, Eugenio Hoes    Past Surgical History:  Procedure Laterality Date   CARPAL TUNNEL RELEASE     rt   CATARACT EXTRACTION W/ INTRAOCULAR LENS  IMPLANT, BILATERAL     EYE SURGERY  2012   Cataracts both eyes   PARATHYROIDECTOMY  01/22/2002   1 gland removed   PLANTAR FASCIA SURGERY  01/23/2003   left   TONSILLECTOMY     TRIGGER FINGER RELEASE Right    thumb- Dr.  Cleophas Dunker in 2023   VAGINAL HYSTERECTOMY     Family History  Problem Relation Age of Onset   Alzheimer's disease Mother    Arthritis Mother    Hearing loss Mother    Hyperlipidemia Mother    Vision loss Mother    Varicose Veins Mother    Leukemia Father    CVA Father    Hypertension Father    Cancer Father    Stroke Father    Hypertension Sister    Melanoma Sister        in brain age 20   Cancer Sister    Lymphoma Maternal Grandmother    Arthritis Maternal Grandmother    Cancer Maternal Grandmother    Hearing loss Maternal Grandmother    Early death Maternal Grandfather    Cancer Maternal Grandfather    Stroke Paternal Grandmother    Dementia Paternal Grandmother    Stroke Paternal Grandfather    Hypertension Paternal Grandfather    Hearing loss Paternal Grandfather    Plantar fasciitis Daughter    Diverticulitis Son    Colon cancer Neg Hx    Social History   Socioeconomic History   Marital status: Married    Spouse name: Not on file   Number of children: Not on file   Years of education: Not on file   Highest education level: 12th grade  Occupational History   Occupation: retired   Tobacco Use   Smoking status: Never   Smokeless tobacco: Never   Tobacco comments:    NA  Vaping Use   Vaping status: Never Used  Substance and Sexual Activity   Alcohol use: No   Drug use: No   Sexual activity: Not Currently    Birth control/protection: Post-menopausal, Surgical    Comment: Spouse has erectile disfunction  Other Topics Concern   Not on file  Social History Narrative   Married. Husband Marie Ramirez. 2 kids by first marriage. 2 step kids. 7 grandkids. 2nd greatgrandchild due November 2024.       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   Social Drivers of [employer]: Low Risk  (03/21/2023)   Overall Financial Resource Strain  (CARDIA)    Difficulty of Paying Living Expenses: Not hard at all  Food Insecurity: No Food Insecurity (03/21/2023)   Hunger Vital Sign    Worried About Running Out of Food in the Last Year: Never true    Ran Out of Food in the Last Year: Never true  Transportation Needs: No Transportation Needs (03/21/2023)   PRAPARE - Administrator, Civil Service (Medical): No    Lack of Transportation (Non-Medical): No  Physical Activity: Insufficiently Active (03/21/2023)   Exercise Vital Sign  Days of Exercise per Week: 4 days    Minutes of Exercise per Session: 30 min  Stress: Stress Concern Present (03/21/2023)   [employer] of Occupational Health - Occupational Stress Questionnaire    Feeling of Stress : To some extent  Social Connections: Moderately Integrated (03/21/2023)   Social Connection and Isolation Panel [NHANES]    Frequency of Communication with Friends and Family: More than three times a week    Frequency of Social Gatherings with Friends and Family: More than three times a week    Attends Religious Services: More than 4 times per year    Active Member of [employer] or Organizations: No    Attends Engineer, structural: Never    Marital Status: Married    Tobacco Counseling Counseling given: Not Answered Tobacco comments: NA    Clinical Intake:  Pre-visit preparation completed: Yes  Pain : No/denies pain     BMI - recorded: 31.35 Nutritional Status: BMI > 30  Obese Nutritional Risks: None Diabetes: No  How often do you need to have someone help you when you read instructions, pamphlets, or other written materials from your doctor or pharmacy?: 1 - Never  Interpreter Needed?: No  Information entered by :: Lanier Ensign, LPN   Activities of Daily Living     03/20/2023   12:33 AM  In your present state of health, do you have any difficulty performing the following activities:  Hearing? 0  Vision? 0  Difficulty concentrating or making  decisions? 0  Walking or climbing stairs? 0  Dressing or bathing? 0  Doing errands, shopping? 0  Preparing Food and eating ? N  Using the Toilet? N  In the past six months, have you accidently leaked urine? N  Do you have problems with loss of bowel control? N  Managing your Medications? N  Managing your Finances? N  Housekeeping or managing your Housekeeping? N    Patient Care Team: Shelva Majestic, MD as PCP - General (Family Medicine) Elinor Parkinson, DPM as Consulting Physician (Podiatry) Valeria Batman, MD (Inactive) as Consulting Physician (Orthopedic Surgery) Sherrilyn Rist, MD as Consulting Physician (Gastroenterology)  Indicate any recent Medical Services you may have received from other than [facility] providers in the past year (date may be approximate).     Assessment:   This is a routine wellness examination for Marie Ramirez.  Hearing/Vision screen Hearing Screening - Comments:: Denies hearing difficulties   Vision Screening - Comments:: Pt follows up annually with dr Charise Killian for annual eye exams    Goals Addressed             This Visit's Progress    Patient Stated       Maintain health and activity        Depression Screen     03/21/2023    3:32 PM 08/16/2022    8:49 AM 02/16/2022    1:02 PM 02/15/2022    8:50 AM 02/09/2021    9:13 AM 02/03/2021    1:14 PM 07/27/2020    9:18 AM  PHQ 2/9 Scores  PHQ - 2 Score 0 0 0 0 0 0 0  PHQ- 9 Score  0 0 0 0      Fall Risk     03/20/2023   12:33 AM 08/16/2022    8:49 AM 02/16/2022    1:03 PM 02/12/2022    4:10 PM 02/03/2021    1:17 PM  Fall Risk   Falls in the past  year? 0 0 0 0 0  Number falls in past yr:  0 0  0  Injury with Fall? 0 0 0 0 0  Risk for fall due to : No Fall Risks No Fall Risks Impaired balance/gait;Impaired mobility;Impaired vision  Impaired vision  Follow up Falls prevention discussed Falls evaluation completed Falls prevention discussed  Falls prevention discussed    MEDICARE RISK AT HOME:   Medicare Risk at Home Any stairs in or around the home?: (Patient-Rptd) Yes If so, are there any without handrails?: (Patient-Rptd) No Home free of loose throw rugs in walkways, pet beds, electrical cords, etc?: (Patient-Rptd) Yes Adequate lighting in your home to reduce risk of falls?: (Patient-Rptd) Yes Life alert?: (Patient-Rptd) No Use of a cane, walker or w/c?: (Patient-Rptd) No Grab bars in the bathroom?: (Patient-Rptd) Yes Shower chair or bench in shower?: (Patient-Rptd) No Elevated toilet seat or a handicapped toilet?: (Patient-Rptd) Yes  TIMED UP AND GO:  Was the test performed?  No  Cognitive Function: 6CIT completed    04/02/2016    8:54 AM  MMSE - Mini Mental State Exam  Not completed: --        03/21/2023    3:38 PM 02/16/2022    1:04 PM 02/03/2021    1:19 PM 01/29/2020    1:16 PM 12/31/2017    3:42 PM  6CIT Screen  What Year? 0 points 0 points 0 points 0 points 0 points  What month? 0 points 0 points 0 points 0 points 0 points  What time? 0 points 0 points 0 points  0 points  Count back from 20 0 points 0 points 0 points 0 points 0 points  Months in reverse 0 points 0 points 0 points 0 points 0 points  Repeat phrase 0 points 0 points 0 points 0 points 0 points  Total Score 0 points 0 points 0 points  0 points    Immunizations Immunization History  Administered Date(s) Administered   Fluad Quad(high Dose 65+) 11/21/2018, 10/27/2019   Influenza Split 10/22/2012   Influenza Whole 01/22/2005, 10/23/2006, 10/22/2008, 10/22/2009   Influenza, High Dose Seasonal PF 11/05/2020   Influenza-Unspecified 10/22/2013, 11/09/2015, 10/31/2017   Moderna Sars-Covid-2 Vaccination 02/18/2019, 03/18/2019   Pneumococcal Polysaccharide-23 12/02/2007   Td 01/22/2001   Tetanus 02/17/2013    Screening Tests Health Maintenance  Topic Date Due   COVID-19 Vaccine (3 - Moderna risk series) 04/15/2019   INFLUENZA VACCINE  04/22/2023 (Originally 08/23/2022)   Zoster Vaccines-  Shingrix (1 of 2) 06/03/2023 (Originally 01/11/1965)   DTaP/Tdap/Td (3 - Tdap) 03/05/2024 (Originally 02/18/2023)   Pneumonia Vaccine 87+ Years old (2 of 2 - PCV) 03/05/2024 (Originally 12/01/2008)   MAMMOGRAM  11/20/2023   Medicare Annual Wellness (AWV)  03/20/2024   DEXA SCAN  Completed   Hepatitis C Screening  Completed   HPV VACCINES  Aged Out   Colonoscopy  Discontinued    Health Maintenance  Health Maintenance Due  Topic Date Due   COVID-19 Vaccine (3 - Moderna risk series) 04/15/2019   Health Maintenance Items Addressed: See Nurse Notes  Additional Screening:  Vision Screening: Recommended annual ophthalmology exams for early detection of glaucoma and other disorders of the eye.  Dental Screening: Recommended annual dental exams for proper oral hygiene  Community Resource Referral / Chronic Care Management: CRR required this visit?  No   CCM required this visit?  No     Plan:     I have personally reviewed and noted the following in the patient's  chart:   Medical and social history Use of alcohol, tobacco or illicit drugs  Current medications and supplements including opioid prescriptions. Patient is not currently taking opioid prescriptions. Functional ability and status Nutritional status Physical activity Advanced directives List of other physicians Hospitalizations, surgeries, and ER visits in previous 12 months Vitals Screenings to include cognitive, depression, and falls Referrals and appointments  In addition, I have reviewed and discussed with patient certain preventive protocols, quality metrics, and best practice recommendations. A written personalized care plan for preventive services as well as general preventive health recommendations were provided to patient.     Marzella Schlein, LPN   1/61/0960   After Visit Summary: (MyChart) Due to this being a telephonic visit, the after visit summary with patients personalized plan was offered to  patient via MyChart   Notes: Nothing significant to report at this time.

## 2023-03-23 ENCOUNTER — Encounter: Payer: Self-pay | Admitting: Family Medicine

## 2023-04-12 ENCOUNTER — Encounter: Payer: Self-pay | Admitting: Gastroenterology

## 2023-07-10 DIAGNOSIS — H04123 Dry eye syndrome of bilateral lacrimal glands: Secondary | ICD-10-CM | POA: Diagnosis not present

## 2023-08-14 ENCOUNTER — Other Ambulatory Visit: Payer: Self-pay | Admitting: Family Medicine

## 2023-09-09 ENCOUNTER — Ambulatory Visit (INDEPENDENT_AMBULATORY_CARE_PROVIDER_SITE_OTHER): Payer: Medicare Other | Admitting: Family Medicine

## 2023-09-09 ENCOUNTER — Ambulatory Visit: Payer: Self-pay | Admitting: Family Medicine

## 2023-09-09 ENCOUNTER — Encounter: Payer: Self-pay | Admitting: Family Medicine

## 2023-09-09 VITALS — BP 118/70 | HR 59 | Temp 97.2°F | Ht 63.0 in | Wt 181.6 lb

## 2023-09-09 DIAGNOSIS — I10 Essential (primary) hypertension: Secondary | ICD-10-CM | POA: Diagnosis not present

## 2023-09-09 DIAGNOSIS — E785 Hyperlipidemia, unspecified: Secondary | ICD-10-CM

## 2023-09-09 DIAGNOSIS — M1A0790 Idiopathic chronic gout, unspecified ankle and foot, without tophus (tophi): Secondary | ICD-10-CM

## 2023-09-09 DIAGNOSIS — N183 Chronic kidney disease, stage 3 unspecified: Secondary | ICD-10-CM | POA: Diagnosis not present

## 2023-09-09 LAB — LDL CHOLESTEROL, DIRECT: Direct LDL: 93 mg/dL

## 2023-09-09 LAB — COMPREHENSIVE METABOLIC PANEL WITH GFR
ALT: 27 U/L (ref 0–35)
AST: 46 U/L — ABNORMAL HIGH (ref 0–37)
Albumin: 4.3 g/dL (ref 3.5–5.2)
Alkaline Phosphatase: 57 U/L (ref 39–117)
BUN: 18 mg/dL (ref 6–23)
CO2: 26 meq/L (ref 19–32)
Calcium: 10 mg/dL (ref 8.4–10.5)
Chloride: 100 meq/L (ref 96–112)
Creatinine, Ser: 1.16 mg/dL (ref 0.40–1.20)
GFR: 45.75 mL/min — ABNORMAL LOW (ref >60.00)
Glucose, Bld: 93 mg/dL (ref 70–99)
Potassium: 3.6 meq/L (ref 3.5–5.1)
Sodium: 140 meq/L (ref 135–145)
Total Bilirubin: 0.8 mg/dL (ref 0.2–1.2)
Total Protein: 7.4 g/dL (ref 6.0–8.3)

## 2023-09-09 LAB — CBC WITH DIFFERENTIAL/PLATELET
Basophils Absolute: 0.1 K/uL (ref 0.0–0.1)
Basophils Relative: 1.3 % (ref 0.0–3.0)
Eosinophils Absolute: 0.1 K/uL (ref 0.0–0.7)
Eosinophils Relative: 2.1 % (ref 0.0–5.0)
HCT: 44.9 % (ref 36.0–46.0)
Hemoglobin: 15.2 g/dL — ABNORMAL HIGH (ref 12.0–15.0)
Lymphocytes Relative: 21.7 % (ref 12.0–46.0)
Lymphs Abs: 1.2 K/uL (ref 0.7–4.0)
MCHC: 33.9 g/dL (ref 30.0–36.0)
MCV: 89.4 fl (ref 78.0–100.0)
Monocytes Absolute: 0.5 K/uL (ref 0.1–1.0)
Monocytes Relative: 9.1 % (ref 3.0–12.0)
Neutro Abs: 3.5 K/uL (ref 1.4–7.7)
Neutrophils Relative %: 65.8 % (ref 43.0–77.0)
Platelets: 315 K/uL (ref 150.0–400.0)
RBC: 5.02 Mil/uL (ref 3.87–5.11)
RDW: 13 % (ref 11.5–15.5)
WBC: 5.4 K/uL (ref 4.0–10.5)

## 2023-09-09 LAB — URIC ACID: Uric Acid, Serum: 8.4 mg/dL — ABNORMAL HIGH (ref 2.4–7.0)

## 2023-09-09 NOTE — Patient Instructions (Addendum)
 Please stop by lab before you go If you have mychart- we will send your results within 3 business days of us  receiving them.  If you do not have mychart- we will call you about results within 5 business days of us  receiving them.  *please also note that you will see labs on mychart as soon as they post. I will later go in and write notes on them- will say notes from Dr. Katrinka   I'm proud of you for your hard work helping your sister out! Great job!   Recommended follow up: Return in about 6 months (around 03/11/2024) for physical or sooner if needed.Schedule b4 you leave.

## 2023-09-09 NOTE — Progress Notes (Signed)
 Phone [phone] In person visit   Subjective:   Marie Ramirez is a 78 y.o. year old very pleasant female patient who presents for/with See problem oriented charting Chief Complaint  Patient presents with   Hypertension   Hyperlipidemia    Past Medical History-  Patient Active Problem List   Diagnosis Date Noted   CKD (chronic kidney disease), stage III (HCC) 12/25/2017    Priority: Medium    Hyperlipidemia 04/04/2016    Priority: Medium    GERD (gastroesophageal reflux disease) 05/09/2015    Priority: Medium    Vaginitis, atrophic 02/24/2014    Priority: Medium    Hematuria 12/17/2006    Priority: Medium    Essential hypertension 09/06/2006    Priority: Medium    Carpal tunnel syndrome, left upper limb 10/16/2017    Priority: Low   Osteopenia 03/15/2014    Priority: Low   Obesity 05/31/2011    Priority: Low   Allergic rhinitis 09/06/2006    Priority: Low   Pain in right foot 11/07/2021    Priority: 1.   Trigger thumb, right thumb 07/05/2021    Priority: 1.   Osteoarthritis of left knee 02/20/2016    Priority: 1.    Medications- reviewed and updated Current Outpatient Medications  Medication Sig Dispense Refill   allopurinol  (ZYLOPRIM ) 100 MG tablet Take 1 tablet (100 mg total) by mouth daily. 30 tablet 6   atenolol -chlorthalidone  (TENORETIC ) 50-25 MG tablet TAKE 1/2 TABLET BY MOUTH DAILY 46 tablet 3   atorvastatin  (LIPITOR) 20 MG tablet Take 1 tablet (20 mg total) by mouth 3 (three) times a week. 39 tablet 3   calcium  citrate-vitamin D (CITRACAL+D) 315-200 MG-UNIT per tablet Take 1 tablet by mouth daily.     famotidine  (PEPCID ) 20 MG tablet Take 1 tablet (20 mg total) by mouth 2 (two) times daily. (Patient taking differently: Take 20 mg by mouth daily.) 180 tablet 3   fexofenadine (ALLEGRA) 180 MG tablet Take 180 mg by mouth daily.     fluorometholone (FML) 0.1 % ophthalmic suspension SMARTSIG:In Eye(s)     fluticasone  (FLONASE ) 50 MCG/ACT nasal spray PLACE 2  SPRAYS IN EACH NOSTRIL DAILY 32 g 5   No current facility-administered medications for this visit.     Objective:  BP 118/70 (BP Location: Right Arm, Patient Position: Sitting, Cuff Size: Normal)   Pulse (!) 59   Temp (!) 97.2 F (36.2 C) (Temporal)   Ht 5' 3 (1.6 m)   Wt 181 lb 9.6 oz (82.4 kg)   SpO2 97%   BMI 32.17 kg/m  Gen: NAD, resting comfortably CV: RRR no murmurs rubs or gallops Lungs: CTAB no crackles, wheeze, rhonchi Abdomen: soft/nontender/nondistended/normal bowel sounds.  Ext: minimal edema Skin: warm, dry     Assessment and Plan   #social update- wife dealing with melanoma in 2025- supporting her through this- had metastases to brain. Had to quit her job. She and sister leaning on faith.  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   #hypertension #CKD III S: medication: atenolol  chlorthalidone  50-25mg - half tablet -typically GFR in 40s range- knows tylenol only- avoid nsaids  BP Readings from Last 3 Encounters:  09/09/23 118/70  03/06/23 105/80  02/19/23 (!) 142/74  A/P: Hypertension- well controlled continue current medications   CKD III- hopefully stable- update cmp today.  #hyperlipidemia-LDL typically under 100 S: Medication:Atorvastatin  20 mg 2x a week--> 3x a week in February 2025 - still occasional leg cramping Lab Results  Component  Value Date   CHOL 185 03/06/2023   HDL 47.90 03/06/2023   LDLCALC 103 (H) 03/06/2023   LDLDIRECT 106.0 08/16/2022   TRIG 171.0 (H) 03/06/2023   CHOLHDL 4 03/06/2023  A/P: hoping mild improvement and we will likely tolerate LDL under 100 as having some myalgias   #Gout S: Medication: Allopurinol  100 mg started February 2025 with uric acid of 9.3 with left foot pain flares - no flares since starting!  A/P:doing well update uric acid but even if over 6 without flares likely continue current medications      # GERD S:Medication: Pepcid  20 mg- mainly just needs in morning A/P: doing well-  continue current medications     #Osteopenia history-04/17/2023 -1.4 at lumbar spine down 5% - Recommended calcium  1200 mg through diet and vitamin D 1000 units and weightbearing exercise- a lot of walking supporting sister at hospital   - prior set back- gout was getting in the way so when things settle with sister she will try to get more intentional weight bearing exercise in   Recommended follow up: Return in about 6 months (around 03/11/2024) for physical or sooner if needed.Schedule b4 you leave. Future Appointments  Date Time Provider Department Center  03/24/2024  3:40 PM [facility] ANNUAL WELLNESS VISIT 1 [facility]   Lab/Order associations:   ICD-10-CM   1. Essential hypertension  I10     2. Hyperlipidemia, unspecified hyperlipidemia type  E78.5 Comprehensive metabolic panel with GFR    CBC with Differential/Platelet    LDL cholesterol, direct    3. Stage 3 chronic kidney disease, unspecified whether stage 3a or 3b CKD (HCC)  N18.30     4. Idiopathic chronic gout of foot without tophus, unspecified laterality  M1A.0790 Uric acid      No orders of the defined types were placed in this encounter.   Return precautions advised.  Garnette Lukes, MD

## 2023-09-10 NOTE — Telephone Encounter (Signed)
 Forwarding to Dr. Katrinka to review and advise.

## 2023-09-29 ENCOUNTER — Other Ambulatory Visit: Payer: Self-pay | Admitting: Family Medicine

## 2023-10-29 ENCOUNTER — Telehealth: Admitting: Physician Assistant

## 2023-10-29 ENCOUNTER — Other Ambulatory Visit: Payer: Self-pay | Admitting: Family Medicine

## 2023-10-29 DIAGNOSIS — M1A0790 Idiopathic chronic gout, unspecified ankle and foot, without tophus (tophi): Secondary | ICD-10-CM

## 2023-10-29 MED ORDER — PREDNISONE 20 MG PO TABS: 40.0000 mg | ORAL_TABLET | Freq: Every day | ORAL | 0 refills | Status: DC

## 2023-10-29 MED ORDER — PREDNISONE 20 MG PO TABS: 40.0000 mg | ORAL_TABLET | Freq: Every day | ORAL | 0 refills | Status: AC

## 2023-10-29 NOTE — Progress Notes (Signed)

## 2023-10-30 NOTE — Telephone Encounter (Signed)
 The 11 is already taken today. Anywhere else you may want to add her?

## 2023-10-30 NOTE — Telephone Encounter (Signed)
 Patient no longer needs this refill due to doing an e-visit yesterday and they prescribed it for her.

## 2023-11-25 ENCOUNTER — Encounter: Payer: Self-pay | Admitting: Radiology

## 2023-11-27 LAB — HM MAMMOGRAPHY

## 2023-11-29 ENCOUNTER — Encounter: Payer: Self-pay | Admitting: Family Medicine

## 2024-03-24 ENCOUNTER — Ambulatory Visit: Payer: Medicare Other

## 2024-04-01 ENCOUNTER — Encounter: Admitting: Family Medicine
